# Patient Record
Sex: Female | Born: 1955 | Race: White | Hispanic: No | Marital: Married | Smoking: Never smoker
Health system: Southern US, Community
[De-identification: ages and names within clinical notes are randomized; demographics above are authoritative.]

## PROBLEM LIST (undated history)

## (undated) DIAGNOSIS — R1310 Dysphagia, unspecified: Secondary | ICD-10-CM

## (undated) DIAGNOSIS — K219 Gastro-esophageal reflux disease without esophagitis: Secondary | ICD-10-CM

## (undated) DIAGNOSIS — R0789 Other chest pain: Secondary | ICD-10-CM

## (undated) DIAGNOSIS — M1990 Unspecified osteoarthritis, unspecified site: Secondary | ICD-10-CM

## (undated) DIAGNOSIS — R001 Bradycardia, unspecified: Secondary | ICD-10-CM

## (undated) DIAGNOSIS — R002 Palpitations: Secondary | ICD-10-CM

## (undated) HISTORY — PX: CATARACT EXTRACTION: SUR2

## (undated) HISTORY — DX: Dysphagia, unspecified: R13.10

## (undated) HISTORY — DX: Other chest pain: R07.89

## (undated) HISTORY — PX: KNEE SURGERY: SHX244

## (undated) HISTORY — PX: WRIST SURGERY: SHX841

## (undated) HISTORY — PX: HEMORRHOID SURGERY: SHX153

## (undated) HISTORY — DX: Palpitations: R00.2

## (undated) HISTORY — DX: Bradycardia, unspecified: R00.1

## (undated) SURGERY — EGD (ESOPHAGOGASTRODUODENOSCOPY)
Anesthesia: Moderate Sedation

---

## 2000-12-03 ENCOUNTER — Encounter: Payer: Self-pay | Admitting: *Deleted

## 2000-12-03 ENCOUNTER — Ambulatory Visit (HOSPITAL_COMMUNITY): Admission: RE | Admit: 2000-12-03 | Discharge: 2000-12-03 | Payer: Self-pay | Admitting: *Deleted

## 2000-12-28 ENCOUNTER — Other Ambulatory Visit: Admission: RE | Admit: 2000-12-28 | Discharge: 2000-12-28 | Payer: Self-pay | Admitting: General Surgery

## 2005-09-01 ENCOUNTER — Ambulatory Visit (HOSPITAL_COMMUNITY): Payer: Self-pay | Admitting: Psychiatry

## 2005-09-06 ENCOUNTER — Ambulatory Visit (HOSPITAL_COMMUNITY): Payer: Self-pay | Admitting: Psychiatry

## 2005-09-12 ENCOUNTER — Ambulatory Visit (HOSPITAL_COMMUNITY): Payer: Self-pay | Admitting: Psychiatry

## 2005-09-19 ENCOUNTER — Ambulatory Visit (HOSPITAL_COMMUNITY): Payer: Self-pay | Admitting: Psychiatry

## 2005-10-06 ENCOUNTER — Ambulatory Visit (HOSPITAL_COMMUNITY): Payer: Self-pay | Admitting: Psychiatry

## 2006-01-05 ENCOUNTER — Ambulatory Visit: Payer: Self-pay | Admitting: Internal Medicine

## 2006-01-05 ENCOUNTER — Ambulatory Visit (HOSPITAL_COMMUNITY): Admission: RE | Admit: 2006-01-05 | Discharge: 2006-01-05 | Payer: Self-pay | Admitting: Internal Medicine

## 2008-12-20 ENCOUNTER — Emergency Department (HOSPITAL_COMMUNITY): Admission: EM | Admit: 2008-12-20 | Discharge: 2008-12-20 | Payer: Self-pay | Admitting: Emergency Medicine

## 2010-06-18 ENCOUNTER — Emergency Department (HOSPITAL_COMMUNITY)
Admission: EM | Admit: 2010-06-18 | Discharge: 2010-06-18 | Payer: Self-pay | Source: Home / Self Care | Admitting: Emergency Medicine

## 2010-11-04 NOTE — Op Note (Signed)
Annette Vance, Annette Vance               ACCOUNT NO.:  [id number]   MEDICAL RECORD NO.:  [id number]          PATIENT TYPE:  AMB   LOCATION:  DAY                           FACILITY:  [facility]   PHYSICIAN:  Lionel December, M.D.    DATE OF BIRTH:  1955/11/24   DATE OF PROCEDURE:  01/05/2006  DATE OF DISCHARGE:                                 OPERATIVE REPORT   PROCEDURE:  Esophagogastroduodenoscopy with esophageal dilation.   [name] is 55 year old Caucasian female who has had sporadic episodes of  dysphagia over the last few years, but she had an episode over the weekend.  She had a foreign body over the weekend while she was swallowing meat.  It  finally cleared her esophagus 3 hours later.  She had another episode this  morning while she was eating bacon.  She was seen by Dr. Nobie Putnam and sent  over for therapeutic procedure.  She states that she possibly has passed  past foreign body, again as she is not having chest pain any more.  She  denies chronic frequent heartburn.  Procedure was reviewed the patient;  informed consent was obtained.   MEDICATIONS FOR CONSCIOUS SEDATION:  1.  Benzocaine spray for pharyngeal topical anesthesia.  2.  Demerol 40 mg IV.  3.  Versed 10 mg IV.   FINDINGS:  Procedure performed in endoscopy suite.  The patient's vital  signs and O2 saturation were monitored during the procedure and remained  stable.  The patient was placed in left lateral decubitus position, and  Olympus video scope was passed via oropharynx without any difficulty into  esophagus.   Esophagus mucosa of the esophagus normal.  There was no foreign body in the  esophagus.  There was a prominent Schatzki's ring at 37 cm from the  incisors.  Hiatus was at 39.  She had small sliding hiatal hernia.  There  were no erosions also noted.   Stomach.  It was empty and distended very well with insufflation.  Folds of  proximal stomach were normal.  Examination mucosa at body, antrum, pyloric  channel as  well as angularis, fundus, and cardia was normal.   Duodenum and bulbar mucosa was normal.  Scope was passed in second part of  the duodenum mucosa, and folds were normal.   Endoscope was pulled back in the stomach.  Balloon dilator was passed  through the scope.  A guidewire was pushes in the gastric lumen.  Endoscope  was pulled in the body of the stomach and balloon dilator was positioned  across the distal esophagus and initially insufflated to a diameter of 19  mm.  It caused ring to break or disrupt at 3 o'clock.  Subsequently at the  insufflator diameter of 20 mm.  A ring was disrupted at 2 more points and  was completely obliterated.  Balloon was disrupted at 2 more sites and  completely obliterated.  There was minimal oozing which stopped  spontaneously.  Pictures taken for the record.  Endoscope was withdrawn.  The patient tolerated the procedure well.   FINAL DIAGNOSIS:  1.  High-grade Schatzki's  ring which was dilated/disrupted using balloon up      to 20 mm.  2.  Small sliding hiatal hernia.  3.  Foreign body spontaneously passed.   RECOMMENDATIONS:  The patient advised to chew her food thoroughly.   She will call us if dysphagia recurs.      Lionel December, M.D.  Electronically Signed     NR/MEDQ  D:  01/05/2006  T:  01/05/2006  Job:  [id number]   cc:   Patrica Duel, M.D.  Fax: [phone]

## 2011-05-25 ENCOUNTER — Encounter (INDEPENDENT_AMBULATORY_CARE_PROVIDER_SITE_OTHER): Payer: Self-pay | Admitting: *Deleted

## 2011-05-31 ENCOUNTER — Ambulatory Visit (INDEPENDENT_AMBULATORY_CARE_PROVIDER_SITE_OTHER): Payer: Self-pay | Admitting: Internal Medicine

## 2011-06-07 ENCOUNTER — Encounter (INDEPENDENT_AMBULATORY_CARE_PROVIDER_SITE_OTHER): Payer: Self-pay | Admitting: *Deleted

## 2012-05-02 IMAGING — CR DG CHEST 2V
2 series · 2 of 2 positions shown · non-contrast
Comparison: 12/20/2008

CLINICAL DATA: Chest congestion.  Cough.

CHEST - 2 VIEW

[view not recorded (1 of 2)]
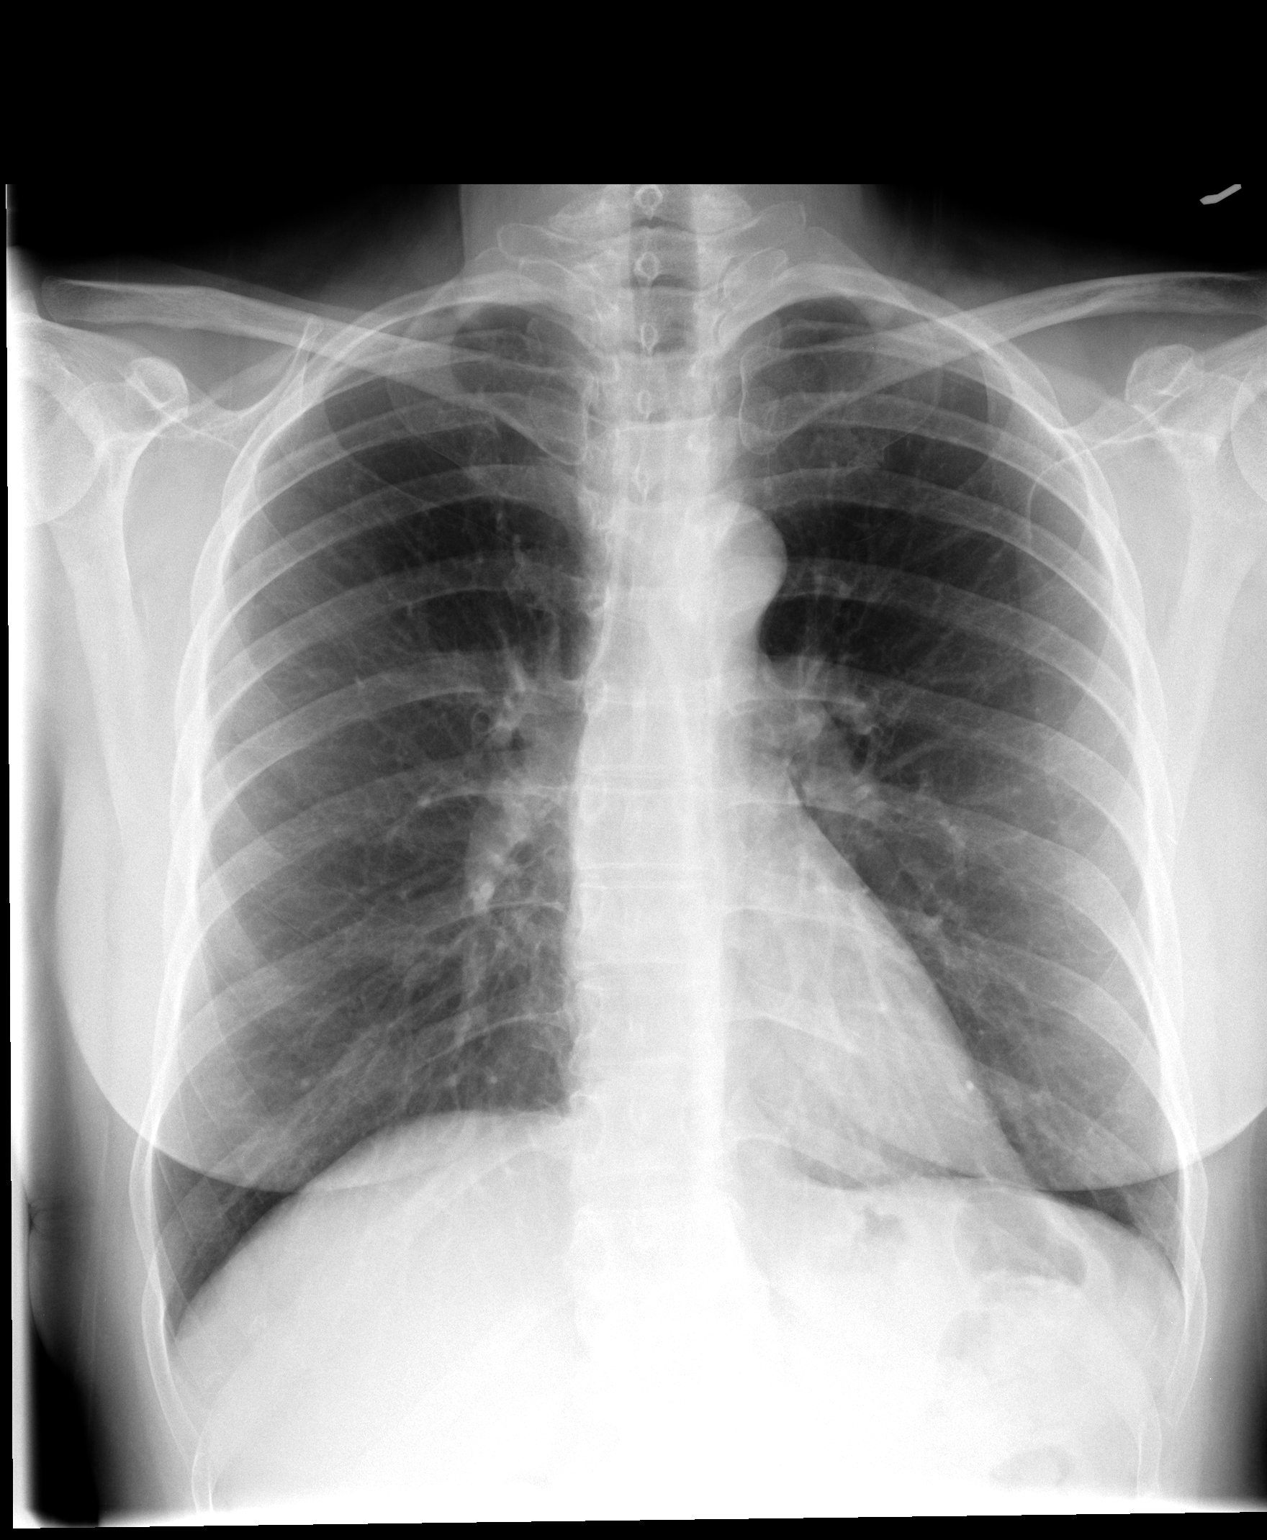

[view not recorded (2 of 2)]
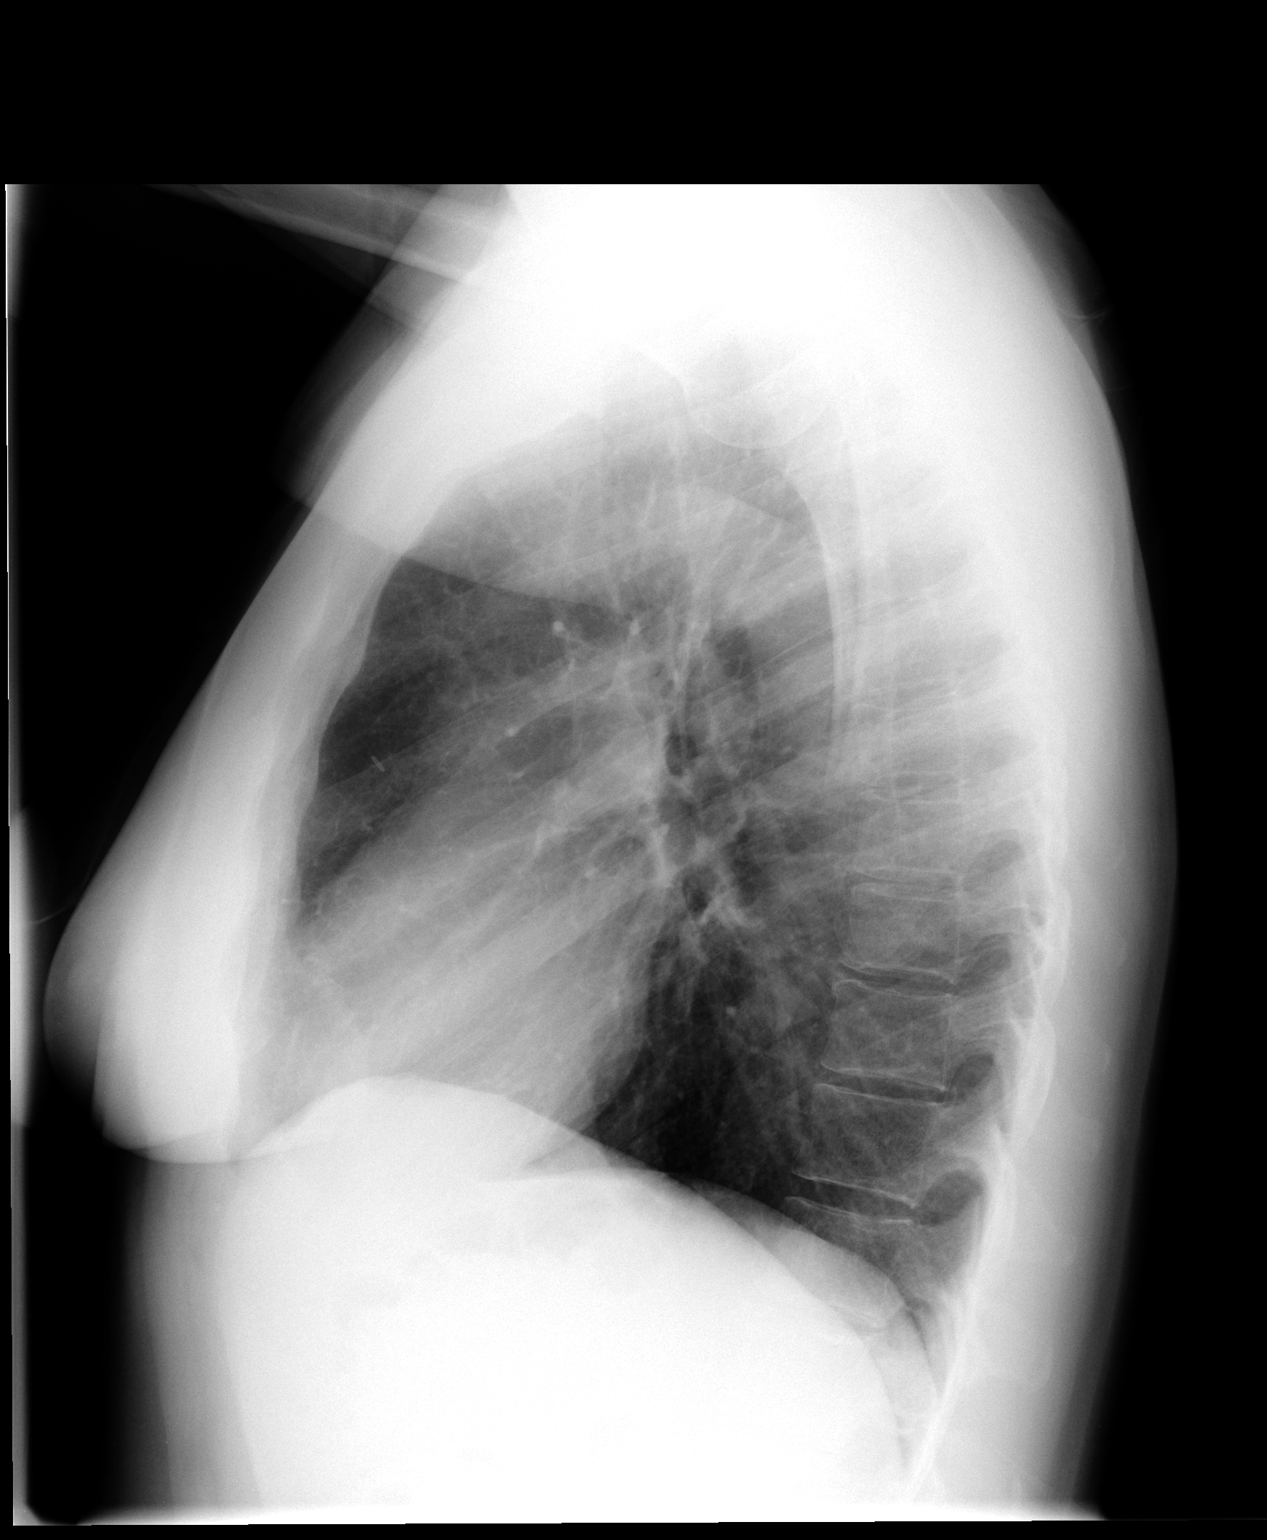

[2 of 2 positions shown; findings below may reference images not displayed]

FINDINGS: Minimal S-shaped spinal curvature. Midline trachea.
Normal heart size and mediastinal contours. No pleural effusion or
pneumothorax.  Mild biapical pleural thickening. Clear lungs.
IMPRESSION: No acute cardiopulmonary disease.

## 2013-11-19 ENCOUNTER — Encounter (INDEPENDENT_AMBULATORY_CARE_PROVIDER_SITE_OTHER): Payer: Self-pay | Admitting: *Deleted

## 2013-11-26 ENCOUNTER — Other Ambulatory Visit (INDEPENDENT_AMBULATORY_CARE_PROVIDER_SITE_OTHER): Payer: Self-pay | Admitting: *Deleted

## 2013-11-26 ENCOUNTER — Encounter (INDEPENDENT_AMBULATORY_CARE_PROVIDER_SITE_OTHER): Payer: Self-pay | Admitting: *Deleted

## 2013-11-26 ENCOUNTER — Encounter (INDEPENDENT_AMBULATORY_CARE_PROVIDER_SITE_OTHER): Payer: Self-pay | Admitting: Internal Medicine

## 2013-11-26 ENCOUNTER — Ambulatory Visit (INDEPENDENT_AMBULATORY_CARE_PROVIDER_SITE_OTHER): Payer: 59 | Admitting: Internal Medicine

## 2013-11-26 VITALS — BP 90/60 | HR 72 | Temp 98.0°F | Ht 67.5 in | Wt 159.6 lb

## 2013-11-26 DIAGNOSIS — R1310 Dysphagia, unspecified: Secondary | ICD-10-CM

## 2013-11-26 NOTE — Patient Instructions (Signed)
EGD/ED 

## 2013-11-26 NOTE — Progress Notes (Signed)
This encounter was created in error - please disregard.

## 2013-11-26 NOTE — Progress Notes (Signed)
Subjective:     Patient ID: Annette Vance, female   DOB: November 29, 1955, 58 y.o.   MRN: [id number]  HPI Referred to our office by Dr Hilma Favors [facility]) for GERD and dysphagia.  Presents today with c/o dysphagia. She says when the foods lodges it is very painful. Occurs 3-4 times in the pat 12 months. Chicken, steak, corn bread will lodge.  She has to chew her food well. Last episode 3-4 months ago.  Appetite is good. No weight loss.  No abdominal pain. BMs move normal. No melena or bright red rectal bleeding.  Hx of dysphagia in past and has undergone an EGD/ED (see below)    01/05/2006 EGD/ED: dysphagia: Dr. Laural Golden: FINAL DIAGNOSIS:  1. High-grade Schatzki's ring which was dilated/disrupted using balloon up  to 20 mm.  2. Small sliding hiatal hernia.  3. Foreign body spontaneously passed.     Review of Systems Past Medical History  Diagnosis Date  . Dysphagia     Past Surgical History  Procedure Laterality Date  . Knee surgery      1996  . Wrist surgery      ?    No Known Allergies  No current outpatient prescriptions on file prior to visit.   No current facility-administered medications on file prior to visit.  Single, One daughter in good health.       Objective:   Physical Exam  Filed Vitals:   11/26/13 1606  BP: 90/60  Pulse: 72  Temp: 98 F (36.7 C)  Height: 5' 7.5" (1.715 m)  Weight: 159 lb 9.6 oz (72.394 kg)  Alert and oriented. Skin warm and dry. Oral mucosa is moist.   . Sclera anicteric, conjunctivae is pink. Thyroid not enlarged. No cervical lymphadenopathy. Lungs clear. Heart regular rate and rhythm.  Abdomen is soft. Bowel sounds are positive. No hepatomegaly. No abdominal masses felt. No tenderness.  No edema to lower extremities.        Assessment:     Dysphagia to solids.  Esophageal ring needs to be ruled out.    Plan:     EGD/ED.The risks and benefits such as perforation, bleeding, and infection were reviewed with the patient  and is agreeable.

## 2013-11-27 DIAGNOSIS — R1310 Dysphagia, unspecified: Secondary | ICD-10-CM | POA: Insufficient documentation

## 2013-12-10 ENCOUNTER — Encounter (HOSPITAL_COMMUNITY): Payer: Self-pay | Admitting: Pharmacy Technician

## 2013-12-11 ENCOUNTER — Encounter (INDEPENDENT_AMBULATORY_CARE_PROVIDER_SITE_OTHER): Payer: Self-pay | Admitting: *Deleted

## 2013-12-11 NOTE — Progress Notes (Signed)
This encounter was created in error - please disregard.

## 2013-12-25 ENCOUNTER — Encounter (HOSPITAL_COMMUNITY): Payer: Self-pay | Admitting: *Deleted

## 2013-12-25 ENCOUNTER — Encounter (HOSPITAL_COMMUNITY)
Admission: RE | Disposition: A | Discharge status: Home / Self Care | Payer: Self-pay | Source: Ambulatory Visit | Attending: Internal Medicine

## 2013-12-25 ENCOUNTER — Ambulatory Visit (HOSPITAL_COMMUNITY)
Admission: RE | Admit: 2013-12-25 | Discharge: 2013-12-25 | Disposition: A | Discharge status: Home / Self Care | Payer: 59 | Source: Ambulatory Visit | Attending: Internal Medicine | Admitting: Internal Medicine

## 2013-12-25 DIAGNOSIS — K222 Esophageal obstruction: Secondary | ICD-10-CM | POA: Insufficient documentation

## 2013-12-25 DIAGNOSIS — K228 Other specified diseases of esophagus: Secondary | ICD-10-CM

## 2013-12-25 DIAGNOSIS — K449 Diaphragmatic hernia without obstruction or gangrene: Secondary | ICD-10-CM | POA: Insufficient documentation

## 2013-12-25 DIAGNOSIS — R1310 Dysphagia, unspecified: Secondary | ICD-10-CM | POA: Insufficient documentation

## 2013-12-25 DIAGNOSIS — K2289 Other specified disease of esophagus: Secondary | ICD-10-CM

## 2013-12-25 HISTORY — PX: ESOPHAGOGASTRODUODENOSCOPY: SHX5428

## 2013-12-25 HISTORY — PX: BALLOON DILATION: SHX5330

## 2013-12-25 HISTORY — PX: BIOPSY: SHX5522

## 2013-12-25 SURGERY — EGD (ESOPHAGOGASTRODUODENOSCOPY)
Anesthesia: Moderate Sedation

## 2013-12-25 MED ORDER — MEPERIDINE HCL 50 MG/ML IJ SOLN
INTRAMUSCULAR | Status: DC | PRN
  Administered 2013-12-25 (×2): 25 mg via INTRAVENOUS

## 2013-12-25 MED ORDER — MEPERIDINE HCL 50 MG/ML IJ SOLN
INTRAMUSCULAR | Status: AC
  Filled 2013-12-25: qty 1

## 2013-12-25 MED ORDER — BUTAMBEN-TETRACAINE-BENZOCAINE 2-2-14 % EX AERO
INHALATION_SPRAY | CUTANEOUS | Status: DC | PRN
  Administered 2013-12-25: 2 via TOPICAL

## 2013-12-25 MED ORDER — PANTOPRAZOLE SODIUM 40 MG PO TBEC: 40.0000 mg | DELAYED_RELEASE_TABLET | Freq: Every day | ORAL | Status: DC

## 2013-12-25 MED ORDER — SIMETHICONE 40 MG/0.6ML PO SUSP
ORAL | Status: DC | PRN
  Administered 2013-12-25: 13:00:00

## 2013-12-25 MED ORDER — MIDAZOLAM HCL 5 MG/5ML IJ SOLN
INTRAMUSCULAR | Status: AC
  Filled 2013-12-25: qty 10

## 2013-12-25 MED ORDER — SODIUM CHLORIDE 0.9 % IV SOLN
INTRAVENOUS | Status: DC
  Administered 2013-12-25: 12:00:00 via INTRAVENOUS

## 2013-12-25 MED ORDER — MIDAZOLAM HCL 5 MG/5ML IJ SOLN
INTRAMUSCULAR | Status: DC | PRN
  Administered 2013-12-25 (×5): 2 mg via INTRAVENOUS

## 2013-12-25 NOTE — H&P (Signed)
Annette Vance is an 58 y.o. female.   Chief Complaint: Patient is here for EGD and ED. HPI: Patient is 58 year old Caucasian female who presents with a 12 month history of solid food dysphagia. Lately the symptoms been occurring more frequently. She had 5-6 episodes of food impaction this year relieved with regurgitation. She rarely experiences heartburn. She has good appetite. She denies nausea vomiting abdominal pain or melena. She has history of Schatzki's ring which was last dilated in July 2007.  Past Medical History  Diagnosis Date  . Dysphagia     Past Surgical History  Procedure Laterality Date  . Knee surgery      1996  . Wrist surgery      ?    History reviewed. No pertinent family history. Social History:  reports that she has never smoked. She does not have any smokeless tobacco history on file. She reports that she drinks alcohol. She reports that she does not use illicit drugs.  Allergies: No Known Allergies  Medications Prior to Admission  Medication Sig Dispense Refill  . lactobacillus acidophilus (BACID) TABS tablet Take 2 tablets by mouth 3 (three) times daily.        No results found for this or any previous visit (from the past 48 hour(s)). No results found.  ROS  Blood pressure 102/67, pulse 69, temperature 97.5 F (36.4 C), temperature source Oral, height 5\' 7"  (1.702 m), weight 150 lb (68.04 kg), SpO2 97.00%. Physical Exam  Constitutional: She appears well-developed and well-nourished.  HENT:  Mouth/Throat: Oropharynx is clear and moist.  Eyes: Conjunctivae are normal. No scleral icterus.  Neck: No thyromegaly present.  Cardiovascular: Normal rate, regular rhythm and normal heart sounds.   No murmur heard. Respiratory: Effort normal and breath sounds normal.  GI: Soft. She exhibits no distension and no mass. There is no tenderness.  Musculoskeletal: She exhibits no edema.  Lymphadenopathy:    She has no cervical adenopathy.  Neurological: She  is alert.  Skin: Skin is warm and dry.     Assessment/Plan Solid food dysphagia. History of Schatzki's ring. EGD with ED.  Aunesty Tyson U 12/25/2013, 12:38 PM

## 2013-12-25 NOTE — Op Note (Signed)
EGD PROCEDURE REPORT  PATIENT:  Annette Vance  MR#:  [id number] Birthdate:  1956/03/19, 58 y.o., female Endoscopist:  Dr. Rogene Houston, MD Referred By:  Dr. Purvis Kilts, MD  Procedure Date: 12/25/2013  Procedure:   EGD with ED.  Indications:  Patient is 58 year old Caucasian female who presents with several month history of dysphagia to solids. She said episodes of food impaction this year. She has history of Schatzki's ring which was last dilated in July 2007. She rarely has heartburn.            Informed Consent:  The risks, benefits, alternatives & imponderables which include, but are not limited to, bleeding, infection, perforation, drug reaction and potential missed lesion have been reviewed.  The potential for biopsy, lesion removal, esophageal dilation, etc. have also been discussed.  Questions have been answered.  All parties agreeable.  Please see history & physical in medical record for more information.  Medications:  Demerol 50 mg IV Versed 10 mg IV Cetacaine spray topically for oropharyngeal anesthesia  Description of procedure:  The endoscope was introduced through the mouth and advanced to the second portion of the duodenum without difficulty or limitations. The mucosal surfaces were surveyed very carefully during advancement of the scope and upon withdrawal.  Findings:  Esophagus:  Mucosa of the proximal segment was normal. Mucosa and esophageal body revealed coarse appearance. High grade Schatzki's ring noted at GE junction with focal edema and erythema to mucosa. GEJ:  32 cm Hiatus:  36 cm Stomach:  Stomach was empty and distended very well with insufflation. Folds in the proximal stomach are normal. Examination of mucosa atgastric body, antrum, pyloric channel, angularis fundus and cardia was normal. Hernia was easily seen in this view. Duodenum:  Normal bulbar and post bulbar mucosa.  Therapeutic/Diagnostic Maneuvers Performed:   Schatzki's ring was  dilated/disrupted with balloon dilator. The balloon dilator was advanced with the scope. The guidewire was pushed into the gastric lumen. The scope was pulled back into the esophagus and balloon was positioned across Schatzki's ring and insufflated to a diameter of 15 mm and subsequently 16.5 and to 18 mm disrupting the ring. The ring was further disrupted the focal biopsy. Biopsy was taken from mucosa esophageal body for routine histology.  Complications:  None  Impression: Coarse appearance to esophageal mucosa raising possibility of eosinophilic esophagitis. High grade Schatzki's ring but focal changes of reflux esophagitis. Small sliding hiatal hernia. Schatzki's ring dilated to/disrupted with balloon to a diameter of 18 mm. It was further disrupted with focal biopsy. Biopsy taken from esophageal mucosa to rule out eosinophilic esophagitis.  Recommendations:  Anti-reflux measures. Pantoprazole 40 mg by mouth q. A.m. I will be contacting patient results of biopsy and further recommendations.  Tory Septer U  12/25/2013  1:10 PM  CC: Dr. Hilma Favors, Betsy Coder, MD & Dr. Rayne Du ref. provider found

## 2013-12-25 NOTE — Discharge Instructions (Signed)
Resume usual medications and diet. Pantoprazole 40 mg by mouth 30 minutes before breakfast daily. No driving for 24 hours. Physician will call with biopsy results.   Dysphagia Swallowing problems (dysphagia) occur when solids and liquids seem to stick in your throat on the way down to your stomach, or the food takes longer to get to the stomach. Other symptoms include regurgitating food, noises coming from the throat, chest discomfort with swallowing, and a feeling of fullness or the feeling of something being stuck in your throat when swallowing. When blockage in your throat is complete it may be associated with drooling. CAUSES  Problems with swallowing may occur because of problems with the muscles. The food cannot be propelled in the usual manner into your stomach. You may have ulcers, scar tissue, or inflammation in the tube down which food travels from your mouth to your stomach (esophagus), which blocks food from passing normally into the stomach. Causes of inflammation include:  Acid reflux from your stomach into your esophagus.  Infection.  Radiation treatment for cancer.  Medicines taken without enough fluids to wash them down into your stomach. You may have nerve problems that prevent signals from being sent to the muscles of your esophagus to contract and move your food down to your stomach. Globus pharyngeus is a relatively common problem in which there is a sense of an obstruction or difficulty in swallowing, without any physical abnormalities of the swallowing passages being found. This problem usually improves over time with reassurance and testing to rule out other causes. DIAGNOSIS Dysphagia can be diagnosed and its cause can be determined by tests in which you swallow a white substance that helps illuminate the inside of your throat (contrast medium) while X-rays are taken. Sometimes a flexible telescope that is inserted down your throat (endoscopy) to look at your esophagus  and stomach is used. TREATMENT   If the dysphagia is caused by acid reflux or infection, medicines may be used.  If the dysphagia is caused by problems with your swallowing muscles, swallowing therapy may be used to help you strengthen your swallowing muscles.  If the dysphagia is caused by a blockage or mass, procedures to remove the blockage may be done. HOME CARE INSTRUCTIONS  Try to eat soft food that is easier to swallow and check your weight on a daily basis to be sure that it is not decreasing.  Be sure to drink liquids when sitting upright (not lying down). SEEK MEDICAL CARE IF:  You are losing weight because you are unable to swallow.  You are coughing when you drink liquids (aspiration).  You are coughing up partially digested food. SEEK IMMEDIATE MEDICAL CARE IF:  You are unable to swallow your own saliva .  You are having shortness of breath or a fever, or both.  You have a hoarse voice along with difficulty swallowing. MAKE SURE YOU:  Understand these instructions.  Will watch your condition.  Will get help right away if you are not doing well or get worse. Document Released: 06/02/2000 Document Revised: 02/05/2013 Document Reviewed: 11/22/2012 [facility] Patient Information 2015 [place], [place]. This information is not intended to replace advice given to you by your health care provider. Make sure you discuss any questions you have with your health care provider. Esophagogastroduodenoscopy Care After Refer to this sheet in the next few weeks. These instructions provide you with information on caring for yourself after your procedure. Your caregiver may also give you more specific instructions. Your treatment has been  planned according to current medical practices, but problems sometimes occur. Call your caregiver if you have any problems or questions after your procedure.  HOME CARE INSTRUCTIONS  Do not eat or drink anything until the numbing medicine (local  anesthetic) has worn off and your gag reflex has returned. You will know that the local anesthetic has worn off when you can swallow comfortably.  Do not drive for 12 hours after the procedure or as directed by your caregiver.  Only take medicines as directed by your caregiver. SEEK MEDICAL CARE IF:   You cannot stop coughing.  You are not urinating at all or less than usual. SEEK IMMEDIATE MEDICAL CARE IF:  You have difficulty swallowing.  You cannot eat or drink.  You have worsening throat or chest pain.  You have dizziness, lightheadedness, or you faint.  You have nausea or vomiting.  You have chills.  You have a fever.  You have severe abdominal pain.  You have black, tarry, or bloody stools. Document Released: 05/22/2012 Document Reviewed: 05/22/2012 [facility] Patient Information 2015 [place]. This information is not intended to replace advice given to you by your health care provider. Make sure you discuss any questions you have with your health care provider.

## 2013-12-29 ENCOUNTER — Encounter (HOSPITAL_COMMUNITY): Payer: Self-pay | Admitting: Internal Medicine

## 2013-12-30 ENCOUNTER — Encounter (INDEPENDENT_AMBULATORY_CARE_PROVIDER_SITE_OTHER): Payer: Self-pay | Admitting: *Deleted

## 2014-06-24 ENCOUNTER — Other Ambulatory Visit (INDEPENDENT_AMBULATORY_CARE_PROVIDER_SITE_OTHER): Payer: Self-pay | Admitting: *Deleted

## 2014-06-24 ENCOUNTER — Encounter (INDEPENDENT_AMBULATORY_CARE_PROVIDER_SITE_OTHER): Payer: Self-pay | Admitting: Internal Medicine

## 2014-06-24 ENCOUNTER — Ambulatory Visit (INDEPENDENT_AMBULATORY_CARE_PROVIDER_SITE_OTHER): Payer: 59 | Admitting: Internal Medicine

## 2014-06-24 ENCOUNTER — Telehealth (INDEPENDENT_AMBULATORY_CARE_PROVIDER_SITE_OTHER): Payer: Self-pay | Admitting: *Deleted

## 2014-06-24 VITALS — BP 82/50 | HR 64 | Temp 98.7°F | Ht 67.0 in | Wt 164.8 lb

## 2014-06-24 DIAGNOSIS — R1314 Dysphagia, pharyngoesophageal phase: Secondary | ICD-10-CM

## 2014-06-24 DIAGNOSIS — Z1211 Encounter for screening for malignant neoplasm of colon: Secondary | ICD-10-CM

## 2014-06-24 NOTE — Patient Instructions (Signed)
Screening colonoscopy 

## 2014-06-24 NOTE — Progress Notes (Signed)
   Subjective:    Patient ID: Annette Vance, female    DOB: 1955/07/04, 59 y.o.   MRN: [id number]  HPI For follow up of her EGD/ED in July of 2015. She tells me she is not having any problems with dysphagia. She is eating what she wants. Appetite is good. No weight loss. Has a BM daily. No melena or BRRB.  Her last colonoscopy was 10 yrs ago at age 59 by Dr. Lindalou Hose which was normal she reports.  She is due for one now.   No GI problems.      12/25/2013 EGD/ED:   Indications: Patient is 59 year old Caucasian female who presents with several month history of dysphagia to solids. She said episodes of food impaction this year. She has history of Schatzki's ring which was last dilated in July 2007. She rarely has heartburn. Coarse appearance to esophageal mucosa raising possibility of eosinophilic esophagitis. High grade Schatzki's ring but focal changes of reflux esophagitis. Small sliding hiatal hernia. Schatzki's ring dilated to/disrupted with balloon to a diameter of 18 mm. It was further disrupted with focal biopsy. Biopsy taken from esophageal mucosa to rule out eosinophilic esophagitis.  Review of Systems  Divorced, one child in good health. Work at [employer].  Past Medical History  Diagnosis Date  . Dysphagia     Past Surgical History  Procedure Laterality Date  . Knee surgery      1996  . Wrist surgery      ?  . Esophagogastroduodenoscopy N/A 12/25/2013    Procedure: ESOPHAGOGASTRODUODENOSCOPY (EGD);  Surgeon: Rogene Houston, MD;  Location: [facility]O SUITE;  Service: Endoscopy;  Laterality: N/A;  1255  . Balloon dilation N/A 12/25/2013    Procedure: BALLOON DILATION;  Surgeon: Rogene Houston, MD;  Location: [facility] ENDO SUITE;  Service: Endoscopy;  Laterality: N/A;  . Esophageal biopsy  12/25/2013    Procedure: BIOPSY;  Surgeon: Rogene Houston, MD;  Location: [facility] ENDO SUITE;   Service: Endoscopy;;    No Known Allergies  Current Outpatient Prescriptions on File Prior to Visit  Medication Sig Dispense Refill  . lactobacillus acidophilus (BACID) TABS tablet Take 2 tablets by mouth 3 (three) times daily.    . pantoprazole (PROTONIX) 40 MG tablet Take 1 tablet (40 mg total) by mouth daily. 30 tablet 5   No current facility-administered medications on file prior to visit.        Objective:   Physical Exam   Filed Vitals:   06/24/14 1137  Height: 5\' 7"  (1.702 m)  Weight: 164 lb 12.8 oz (74.753 kg)   Alert and oriented. Skin warm and dry. Oral mucosa is moist.   . Sclera anicteric, conjunctivae is pink. Thyroid not enlarged. No cervical lymphadenopathy. Lungs clear. Heart regular rate and rhythm.  Abdomen is soft. Bowel sounds are positive. No hepatomegaly. No abdominal masses felt. No tenderness.  No edema to lower extremities.         Assessment & Plan:  Dysphagia resolved after ED in July. No problems. In need of screening colonoscopy.

## 2014-06-24 NOTE — Telephone Encounter (Signed)
Patient needs movi prep 

## 2014-06-26 MED ORDER — PEG-KCL-NACL-NASULF-NA ASC-C 100 G PO SOLR: 1.0000 | Freq: Once | ORAL | Status: DC

## 2014-07-14 ENCOUNTER — Encounter (INDEPENDENT_AMBULATORY_CARE_PROVIDER_SITE_OTHER): Payer: Self-pay | Admitting: *Deleted

## 2014-07-21 ENCOUNTER — Telehealth (INDEPENDENT_AMBULATORY_CARE_PROVIDER_SITE_OTHER): Payer: Self-pay | Admitting: *Deleted

## 2014-07-21 DIAGNOSIS — Z1211 Encounter for screening for malignant neoplasm of colon: Secondary | ICD-10-CM

## 2014-07-21 NOTE — Telephone Encounter (Signed)
Patient needs movi prep, pharmacy didn't get

## 2014-07-23 MED ORDER — PEG-KCL-NACL-NASULF-NA ASC-C 100 G PO SOLR: 1.0000 | Freq: Once | ORAL | Status: DC

## 2014-07-29 ENCOUNTER — Encounter (HOSPITAL_COMMUNITY)
Admission: RE | Disposition: A | Discharge status: Home / Self Care | Payer: Self-pay | Source: Ambulatory Visit | Attending: Internal Medicine

## 2014-07-29 ENCOUNTER — Encounter (HOSPITAL_COMMUNITY): Payer: Self-pay

## 2014-07-29 ENCOUNTER — Ambulatory Visit (HOSPITAL_COMMUNITY)
Admission: RE | Admit: 2014-07-29 | Discharge: 2014-07-29 | Disposition: A | Discharge status: Home / Self Care | Payer: 59 | Source: Ambulatory Visit | Attending: Internal Medicine | Admitting: Internal Medicine

## 2014-07-29 DIAGNOSIS — K219 Gastro-esophageal reflux disease without esophagitis: Secondary | ICD-10-CM | POA: Diagnosis not present

## 2014-07-29 DIAGNOSIS — Z1211 Encounter for screening for malignant neoplasm of colon: Secondary | ICD-10-CM | POA: Insufficient documentation

## 2014-07-29 DIAGNOSIS — K644 Residual hemorrhoidal skin tags: Secondary | ICD-10-CM | POA: Insufficient documentation

## 2014-07-29 DIAGNOSIS — K648 Other hemorrhoids: Secondary | ICD-10-CM

## 2014-07-29 DIAGNOSIS — Z79899 Other long term (current) drug therapy: Secondary | ICD-10-CM | POA: Insufficient documentation

## 2014-07-29 HISTORY — DX: Gastro-esophageal reflux disease without esophagitis: K21.9

## 2014-07-29 HISTORY — PX: COLONOSCOPY: SHX5424

## 2014-07-29 SURGERY — COLONOSCOPY
Anesthesia: Moderate Sedation

## 2014-07-29 MED ORDER — STERILE WATER FOR IRRIGATION IR SOLN
Status: DC | PRN
  Administered 2014-07-29: 09:00:00

## 2014-07-29 MED ORDER — MIDAZOLAM HCL 5 MG/5ML IJ SOLN
INTRAMUSCULAR | Status: DC | PRN
  Administered 2014-07-29 (×6): 2 mg via INTRAVENOUS

## 2014-07-29 MED ORDER — MIDAZOLAM HCL 5 MG/5ML IJ SOLN
INTRAMUSCULAR | Status: AC
  Filled 2014-07-29: qty 10

## 2014-07-29 MED ORDER — SODIUM CHLORIDE 0.9 % IV SOLN
INTRAVENOUS | Status: DC
  Administered 2014-07-29: 08:00:00 via INTRAVENOUS

## 2014-07-29 MED ORDER — MIDAZOLAM HCL 5 MG/5ML IJ SOLN
INTRAMUSCULAR | Status: AC
  Filled 2014-07-29: qty 5

## 2014-07-29 MED ORDER — MEPERIDINE HCL 50 MG/ML IJ SOLN
INTRAMUSCULAR | Status: AC
  Filled 2014-07-29: qty 1

## 2014-07-29 MED ORDER — MEPERIDINE HCL 50 MG/ML IJ SOLN
INTRAMUSCULAR | Status: DC | PRN
  Administered 2014-07-29 (×2): 25 mg via INTRAVENOUS

## 2014-07-29 NOTE — Op Note (Signed)
COLONOSCOPY PROCEDURE REPORT  PATIENT:  Annette Vance  MR#:  [id number] Birthdate:  Apr 13, 1956, 59 y.o., female Endoscopist:  Dr. Rogene Houston, MD Referred By:  Dr. Purvis Kilts, MD  Procedure Date: 07/29/2014  Procedure:   Colonoscopy  Indications:  Patient is 59 year old Caucasian female was undergoing average risk screening colonoscopy. Her last exam was 10 years ago.  Informed Consent:  The procedure and risks were reviewed with the patient and informed consent was obtained.  Medications:  Demerol 50 mg IV Versed 12 mg IV  Description of procedure:  After a digital rectal exam was performed, that colonoscope was advanced from the anus through the rectum and colon to the area of the cecum, ileocecal valve and appendiceal orifice. The cecum was deeply intubated. These structures were well-seen and photographed for the record. From the level of the cecum and ileocecal valve, the scope was slowly and cautiously withdrawn. The mucosal surfaces were carefully surveyed utilizing scope tip to flexion to facilitate fold flattening as needed. The scope was pulled down into the rectum where a thorough exam including retroflexion was performed. Exam was begun with pediatric colonoscope and completed with Slim colonoscope.  Findings:   Prep excellent. Redundant colon. Normal mucosa of cecum, ascending colon, hepatic flexure, transverse colon, splenic flexure, descending and sigmoid colon. Normal rectal mucosa. Prominent hemorrhoids below the dentate line.    Therapeutic/Diagnostic Maneuvers Performed:   None  Complications:  None  Cecal Withdrawal Time:  8 minutes  Impression:  Examination performed to cecum. Redundant colon but no evidence of colonic polyps and diverticulosis. External hemorrhoids.  Recommendations:  Standard instructions given. Next screening exam in 10 years.  Kaelan Amble U  07/29/2014 10:05 AM  CC: Dr. Hilma Favors, Betsy Coder, MD & Dr. Rayne Du ref.  provider found

## 2014-07-29 NOTE — Discharge Instructions (Signed)
Resume usual medications and diet. No driving for 24 hours. Next screening exam in 10 years.  Colonoscopy, Care After Refer to this sheet in the next few weeks. These instructions provide you with information on caring for yourself after your procedure. Your health care provider may also give you more specific instructions. Your treatment has been planned according to current medical practices, but problems sometimes occur. Call your health care provider if you have any problems or questions after your procedure. WHAT TO EXPECT AFTER THE PROCEDURE  After your procedure, it is typical to have the following:  A small amount of blood in your stool.  Moderate amounts of gas and mild abdominal cramping or bloating. HOME CARE INSTRUCTIONS  Do not drive, operate machinery, or sign important documents for 24 hours.  You may shower and resume your regular physical activities, but move at a slower pace for the first 24 hours.  Take frequent rest periods for the first 24 hours.  Walk around or put a warm pack on your abdomen to help reduce abdominal cramping and bloating.  Drink enough fluids to keep your urine clear or pale yellow.  You may resume your normal diet as instructed by your health care provider. Avoid heavy or fried foods that are hard to digest.  Avoid drinking alcohol for 24 hours or as instructed by your health care provider.  Only take over-the-counter or prescription medicines as directed by your health care provider.  If a tissue sample (biopsy) was taken during your procedure:  Do not take aspirin or blood thinners for 7 days, or as instructed by your health care provider.  Do not drink alcohol for 7 days, or as instructed by your health care provider.  Eat soft foods for the first 24 hours. SEEK MEDICAL CARE IF: You have persistent spotting of blood in your stool 2-3 days after the procedure. SEEK IMMEDIATE MEDICAL CARE IF:  You have more than a small spotting of  blood in your stool.  You pass large blood clots in your stool.  Your abdomen is swollen (distended).  You have nausea or vomiting.  You have a fever.  You have increasing abdominal pain that is not relieved with medicine. Document Released: 01/18/2004 Document Revised: 03/26/2013 Document Reviewed: 02/10/2013 [facility] Patient Information 2015 [place], [place]. This information is not intended to replace advice given to you by your health care provider. Make sure you discuss any questions you have with your health care provider.

## 2014-07-29 NOTE — H&P (Signed)
Annette Vance is an 59 y.o. female.   Chief Complaint: Patient is here for colonoscopy. HPI: Patient is 59 year old Caucasian female who is here for screening colonoscopy. She denies abdominal pain change in bowel habits or rectal bleeding. Last colonoscopy was 10 years ago. Family history is negative for CRC.  Past Medical History  Diagnosis Date  . Dysphagia   . GERD (gastroesophageal reflux disease)     Past Surgical History  Procedure Laterality Date  . Knee surgery      1996  . Wrist surgery      ?  . Esophagogastroduodenoscopy N/A 12/25/2013    Procedure: ESOPHAGOGASTRODUODENOSCOPY (EGD);  Surgeon: Rogene Houston, MD;  [facility];  Service: Endoscopy;  Laterality: N/A;  1255  . Balloon dilation N/A 12/25/2013    Procedure: BALLOON DILATION;  Surgeon: Rogene Houston, MD;  [facility];  Service: Endoscopy;  Laterality: N/A;  . Esophageal biopsy  12/25/2013    Procedure: BIOPSY;  Surgeon: Rogene Houston, MD;  [facility];  Service: Endoscopy;;    History reviewed. No pertinent family history. Social History:  reports that she has never smoked. She does not have any smokeless tobacco history on file. She reports that she drinks alcohol. She reports that she does not use illicit drugs.  Allergies: No Known Allergies  Medications Prior to Admission  Medication Sig Dispense Refill  . lactobacillus acidophilus (BACID) TABS tablet Take 2 tablets by mouth 3 (three) times daily.    . pantoprazole (PROTONIX) 40 MG tablet Take 1 tablet (40 mg total) by mouth daily. 30 tablet 5  . peg 3350 powder (MOVIPREP) 100 G SOLR Take 1 kit (200 g total) by mouth once. 1 kit 0    No results found for this or any previous visit (from the past 48 hour(s)). No results found.  ROS  Blood pressure 108/69, pulse 72, temperature 97.9 F (36.6 C), temperature source Oral, resp. rate 21, height $RemoveBe'5\' 7"'gqtsOvTLH$  (1.702 m), weight 152 lb (68.947 kg), SpO2 98 %. Physical Exam   Constitutional: She appears well-developed and well-nourished.  HENT:  Mouth/Throat: Oropharynx is clear and moist.  Eyes: Conjunctivae are normal. No scleral icterus.  Neck: No thyromegaly present.  Cardiovascular: Normal rate, regular rhythm and normal heart sounds.   No murmur heard. Respiratory: Effort normal and breath sounds normal.  GI: Soft. She exhibits no distension and no mass. There is no tenderness.  Musculoskeletal: She exhibits no edema.  Lymphadenopathy:    She has no cervical adenopathy.  Neurological: She is alert.  Skin: Skin is warm and dry.     Assessment/Plan Average risk screening colonoscopy.  Annette Vance 07/29/2014, 9:10 AM

## 2014-07-30 ENCOUNTER — Encounter (HOSPITAL_COMMUNITY): Payer: Self-pay | Admitting: Internal Medicine

## 2015-02-01 ENCOUNTER — Other Ambulatory Visit (INDEPENDENT_AMBULATORY_CARE_PROVIDER_SITE_OTHER): Payer: Self-pay | Admitting: Internal Medicine

## 2016-06-22 DIAGNOSIS — M170 Bilateral primary osteoarthritis of knee: Secondary | ICD-10-CM | POA: Diagnosis not present

## 2016-06-22 DIAGNOSIS — M25562 Pain in left knee: Secondary | ICD-10-CM | POA: Diagnosis not present

## 2016-06-22 DIAGNOSIS — M25561 Pain in right knee: Secondary | ICD-10-CM | POA: Diagnosis not present

## 2016-07-20 DIAGNOSIS — R0789 Other chest pain: Secondary | ICD-10-CM | POA: Diagnosis not present

## 2016-07-20 DIAGNOSIS — R001 Bradycardia, unspecified: Secondary | ICD-10-CM | POA: Diagnosis not present

## 2016-07-20 DIAGNOSIS — Z6823 Body mass index (BMI) 23.0-23.9, adult: Secondary | ICD-10-CM | POA: Diagnosis not present

## 2016-08-22 ENCOUNTER — Ambulatory Visit: Payer: 59 | Admitting: Cardiology

## 2016-08-25 DIAGNOSIS — R002 Palpitations: Secondary | ICD-10-CM

## 2016-08-28 ENCOUNTER — Other Ambulatory Visit (HOSPITAL_COMMUNITY)
Admission: RE | Admit: 2016-08-28 | Discharge: 2016-08-28 | Disposition: A | Discharge status: Home / Self Care | Payer: Commercial Managed Care - HMO | Source: Ambulatory Visit | Attending: Cardiovascular Disease | Admitting: Cardiovascular Disease

## 2016-08-28 ENCOUNTER — Ambulatory Visit (INDEPENDENT_AMBULATORY_CARE_PROVIDER_SITE_OTHER): Payer: Commercial Managed Care - HMO | Admitting: Cardiovascular Disease

## 2016-08-28 ENCOUNTER — Encounter: Payer: Self-pay | Admitting: Cardiovascular Disease

## 2016-08-28 VITALS — BP 100/72 | HR 77 | Ht 67.0 in | Wt 151.0 lb

## 2016-08-28 DIAGNOSIS — R0789 Other chest pain: Secondary | ICD-10-CM | POA: Insufficient documentation

## 2016-08-28 DIAGNOSIS — I9589 Other hypotension: Secondary | ICD-10-CM

## 2016-08-28 DIAGNOSIS — R002 Palpitations: Secondary | ICD-10-CM | POA: Insufficient documentation

## 2016-08-28 DIAGNOSIS — R5383 Other fatigue: Secondary | ICD-10-CM

## 2016-08-28 LAB — CBC
HCT: 42.1 % (ref 36.0–46.0)
HEMOGLOBIN: 14.4 g/dL (ref 12.0–15.0)
MCH: 31.6 pg (ref 26.0–34.0)
MCHC: 34.2 g/dL (ref 30.0–36.0)
MCV: 92.5 fL (ref 78.0–100.0)
Platelets: 191 10*3/uL (ref 150–400)
RBC: 4.55 MIL/uL (ref 3.87–5.11)
RDW: 12.1 % (ref 11.5–15.5)
WBC: 3.8 10*3/uL — ABNORMAL LOW (ref 4.0–10.5)

## 2016-08-28 LAB — BASIC METABOLIC PANEL
Anion gap: 7 (ref 5–15)
BUN: 18 mg/dL (ref 6–20)
CHLORIDE: 106 mmol/L (ref 101–111)
CO2: 24 mmol/L (ref 22–32)
Calcium: 9.6 mg/dL (ref 8.9–10.3)
Creatinine, Ser: 0.81 mg/dL (ref 0.44–1.00)
GFR calc non Af Amer: >60 mL/min (ref >60)
Glucose, Bld: 96 mg/dL (ref 65–99)
Potassium: 4.4 mmol/L (ref 3.5–5.1)
Sodium: 137 mmol/L (ref 135–145)

## 2016-08-28 LAB — CORTISOL: Cortisol, Plasma: 9.5 ug/dL

## 2016-08-28 LAB — TSH: TSH: 3.168 u[IU]/mL (ref 0.350–4.500)

## 2016-08-28 LAB — MAGNESIUM: Magnesium: 1.8 mg/dL (ref 1.7–2.4)

## 2016-08-28 NOTE — Progress Notes (Signed)
CARDIOLOGY CONSULT NOTE  Patient ID: Annette Vance MRN: [id number] DOB/AGE: 1956-01-12 61 y.o.  Admit date: (Not on file) Primary Physician: Purvis Kilts, MD Referring Physician:   Reason for Consultation: palpitations, chest discomfort  HPI: 61 yr old woman with h/o GERD and esophageal dilatation presents for evaluation of chest pain and palpitations.   She says that for the past 2 months, she has experienced "light fluttering sensation" in the upper left part of her chest radiating into the left side of her neck. She exercises routinely and uses DVD's in the morning, walks a lot, and golfs. For the past few months, she has been unable to complete her exercise DVD's and feels fatigued. She denies exertional chest pain and shortness of breath, as well as leg swelling, orthopnea, and syncope.  Drinks 1 cup of coffee daily.  She has had mild left sided chest tightness. If she tries to exercise vigorously, she gets lightheaded.  She recently retired. Denies a history of anxiety.   BP at the [facility] in recent past was 80/42. Denied dizziness then. Says her BP is historically low.  ECG 07/20/16 was normal.   Fam Hx: Father had pacemaker in mid 24's.    No Known Allergies  Current Outpatient Prescriptions  Medication Sig Dispense Refill  . lactobacillus acidophilus (BACID) TABS tablet Take 2 tablets by mouth 3 (three) times daily.     No current facility-administered medications for this visit.     Past Medical History:  Diagnosis Date  . Bradycardia   . Chest discomfort   . Dysphagia   . GERD (gastroesophageal reflux disease)   . Heart palpitations     Past Surgical History:  Procedure Laterality Date  . BALLOON DILATION N/A 12/25/2013   Procedure: BALLOON DILATION;  Surgeon: Rogene Houston, MD;  [facility];  Service: Endoscopy;  Laterality: N/A;  . BIOPSY  12/25/2013   Procedure: BIOPSY;  Surgeon: Rogene Houston, MD;  [facility];  Service: Endoscopy;;  . COLONOSCOPY N/A 07/29/2014   Procedure: COLONOSCOPY;  Surgeon: Rogene Houston, MD;  [facility];  Service: Endoscopy;  Laterality: N/A;  1025 - moved to 9:25 - Ann to notify pt  . ESOPHAGOGASTRODUODENOSCOPY N/A 12/25/2013   Procedure: ESOPHAGOGASTRODUODENOSCOPY (EGD);  Surgeon: Rogene Houston, MD;  [facility];  Service: Endoscopy;  Laterality: N/A;  1255  . KNEE SURGERY     1996  . WRIST SURGERY     ?    Social History   Social History  . Marital status: Divorced    Spouse name: N/A  . Number of children: 1  . Years of education: N/A   Occupational History  . retired    Social History Main Topics  . Smoking status: Never Smoker  . Smokeless tobacco: Never Used  . Alcohol use Yes     Comment: occasional  . Drug use: No  . Sexual activity: Not on file   Other Topics Concern  . Not on file   Social History Narrative  . No narrative on file       Prior to Admission medications   Medication Sig Start Date End Date Taking? Authorizing Provider  lactobacillus acidophilus (BACID) TABS tablet Take 2 tablets by mouth 3 (three) times daily.    Historical Provider, MD  pantoprazole (PROTONIX) 40 MG tablet TAKE 1 TABLET (40 MG TOTAL) BY MOUTH DAILY. 02/01/15   Rogene Houston,  MD     Review of systems complete and found to be negative unless listed above in HPI     Physical exam Blood pressure 100/72, pulse 77, height 5\' 7"  (1.702 m), weight 151 lb (68.5 kg), SpO2 97 %. General: NAD Neck: No JVD, no thyromegaly or thyroid nodule.  Lungs: Clear to auscultation bilaterally with normal respiratory effort. CV: Nondisplaced PMI. Regular rate and rhythm, normal S1/S2, no S3/S4, no murmur.  No peripheral edema.  No carotid bruit.  Normal pedal pulses.  Abdomen: Soft, nontender, no hepatosplenomegaly, no distention. +bowel sounds. Skin: Intact without lesions or rashes.  Neurologic: Alert and oriented x 3.  Psych: Normal  affect. Extremities: No clubbing or cyanosis.  HEENT: Normal.   ECG: Most recent ECG reviewed.  Telemetry: Independently reviewed.  Labs:  No results found for: WBC, HGB, HCT, MCV, PLT No results for input(s): NA, K, CL, CO2, BUN, CREATININE, CALCIUM, PROT, BILITOT, ALKPHOS, ALT, AST, GLUCOSE in the last 168 hours.  Invalid input(s): LABALBU No results found for: CKTOTAL, CKMB, CKMBINDEX, TROPONINI No results found for: CHOL No results found for: HDL No results found for: LDLCALC No results found for: TRIG No results found for: CHOLHDL No results found for: LDLDIRECT       Studies: No results found.  ASSESSMENT AND PLAN:  1. Palpitations: Will obtain one week event monitor. Will check TSH, Mg, BMET, CBC, and random cortisol.  2. Hypotension and fatigue: Will check TSH and random cortisol.  3. Chest tightness: Atypical symptoms for ischemic heart disease. Lacks traditional risk factors. Will obtain stress echocardiogram.  Dispo: fu 1 month.   Signed: Kate Sable, M.D., F.A.C.C.  08/28/2016, 9:02 AM

## 2016-08-28 NOTE — Patient Instructions (Signed)
Your physician recommends that you schedule a follow-up appointment in:  1 month   Your physician has recommended that you wear an event monitor. Event monitors are medical devices that record the heart's electrical activity. Doctors most often [place] these monitors to diagnose arrhythmias. Arrhythmias are problems with the speed or rhythm of the heartbeat. The monitor is a small, portable device. You can wear one while you do your normal daily activities. This is usually used to diagnose what is causing palpitations/syncope (passing out).    Your physician has requested that you have a stress echocardiogram. For further information please visit [email or web address]. Please follow instruction sheet as given.    Your physician recommends that you return for lab work in: today, cbc,bmet,tsh, cortisol,magnesium        Thank you for choosing [facility] !

## 2016-09-04 ENCOUNTER — Ambulatory Visit (HOSPITAL_COMMUNITY)
Admission: RE | Admit: 2016-09-04 | Discharge: 2016-09-04 | Disposition: A | Discharge status: Home / Self Care | Payer: Commercial Managed Care - HMO | Source: Ambulatory Visit | Attending: Cardiovascular Disease | Admitting: Cardiovascular Disease

## 2016-09-04 DIAGNOSIS — R0789 Other chest pain: Secondary | ICD-10-CM | POA: Diagnosis not present

## 2016-09-04 DIAGNOSIS — R002 Palpitations: Secondary | ICD-10-CM | POA: Diagnosis not present

## 2016-09-04 LAB — ECHOCARDIOGRAM STRESS TEST
CHL CUP MPHR: 158 {beats}/min
CSEPPHR: 151 {beats}/min
Estimated workload: 13.4 METS
Exercise duration (min): 10 min
Exercise duration (sec): 23 s
Percent HR: 95 %
RPE: 15
Rest HR: 73 {beats}/min

## 2016-09-04 NOTE — Progress Notes (Signed)
*  PRELIMINARY RESULTS* Echocardiogram Echocardiogram Stress Test has been performed.  Annette Vance 09/04/2016, 9:35 AM

## 2016-09-22 ENCOUNTER — Ambulatory Visit (INDEPENDENT_AMBULATORY_CARE_PROVIDER_SITE_OTHER): Payer: Commercial Managed Care - HMO

## 2016-09-22 DIAGNOSIS — R0789 Other chest pain: Secondary | ICD-10-CM

## 2016-09-22 DIAGNOSIS — R002 Palpitations: Secondary | ICD-10-CM | POA: Diagnosis not present

## 2016-09-27 ENCOUNTER — Telehealth: Payer: Self-pay

## 2016-09-27 NOTE — Telephone Encounter (Signed)
Received "courtesy call " from [employer] [facility] that patient had a "delayed transmission" from yesterday 09/26/16 at 3:11 pm EST but says recorded today at 1:28 pm. Patient activated monitor for dizziness and syncope. They could not reach the patient. I called her phone and got answering machine and left message for her to call back.    In reviewing patient event report in [employer] , strip was documented  NSR with artifact.     Will FYI Dr Bronson Ing

## 2016-09-28 NOTE — Telephone Encounter (Signed)
Received fax from [facility] with comments that they spoke with patient, "she did not pass out" , she was asymptomatic, outside playing golf.     I will FYI Dr Bronson Ing

## 2016-10-02 DIAGNOSIS — I87391 Chronic venous hypertension (idiopathic) with other complications of right lower extremity: Secondary | ICD-10-CM | POA: Diagnosis not present

## 2016-10-04 DIAGNOSIS — G8929 Other chronic pain: Secondary | ICD-10-CM | POA: Diagnosis not present

## 2016-10-04 DIAGNOSIS — M25512 Pain in left shoulder: Secondary | ICD-10-CM | POA: Diagnosis not present

## 2016-10-06 DIAGNOSIS — J300 Vasomotor rhinitis: Secondary | ICD-10-CM | POA: Diagnosis not present

## 2016-10-06 DIAGNOSIS — J302 Other seasonal allergic rhinitis: Secondary | ICD-10-CM | POA: Diagnosis not present

## 2016-10-06 DIAGNOSIS — Z6823 Body mass index (BMI) 23.0-23.9, adult: Secondary | ICD-10-CM | POA: Diagnosis not present

## 2016-10-09 ENCOUNTER — Encounter: Payer: Self-pay | Admitting: Cardiovascular Disease

## 2016-10-09 ENCOUNTER — Ambulatory Visit (INDEPENDENT_AMBULATORY_CARE_PROVIDER_SITE_OTHER): Payer: Commercial Managed Care - HMO | Admitting: Cardiovascular Disease

## 2016-10-09 VITALS — BP 102/60 | HR 100 | Ht 67.0 in | Wt 151.0 lb

## 2016-10-09 DIAGNOSIS — R002 Palpitations: Secondary | ICD-10-CM

## 2016-10-09 DIAGNOSIS — Z136 Encounter for screening for cardiovascular disorders: Secondary | ICD-10-CM | POA: Diagnosis not present

## 2016-10-09 DIAGNOSIS — R0789 Other chest pain: Secondary | ICD-10-CM

## 2016-10-09 DIAGNOSIS — R5383 Other fatigue: Secondary | ICD-10-CM

## 2016-10-09 DIAGNOSIS — IMO0001 Reserved for inherently not codable concepts without codable children: Secondary | ICD-10-CM

## 2016-10-09 MED ORDER — METOPROLOL TARTRATE 25 MG PO TABS: ORAL_TABLET | ORAL | 3 refills | Status: DC

## 2016-10-09 NOTE — Progress Notes (Signed)
SUBJECTIVE: The patient returns for follow-up after undergoing cardiovascular testing performed for the evaluation of chest pain and palpitations.  Event monitoring showed sinus rhythm with PACs.  TSH, basic metabolic panel, hemoglobin, magnesium, and cortisol levels were normal.  Stress echocardiogram was negative for inducible ischemia.  She has had no further episodes of exertional chest tightness.  She has seldom had palpitations, which primarily bothered her around Christmas time. She wonders if she was internalizing stress.  Review of Systems: As per "subjective", otherwise negative.  No Known Allergies  Current Outpatient Prescriptions  Medication Sig Dispense Refill  . lactobacillus acidophilus (BACID) TABS tablet Take 2 tablets by mouth 3 (three) times daily.     No current facility-administered medications for this visit.     Past Medical History:  Diagnosis Date  . Bradycardia   . Chest discomfort   . Dysphagia   . GERD (gastroesophageal reflux disease)   . Heart palpitations     Past Surgical History:  Procedure Laterality Date  . BALLOON DILATION N/A 12/25/2013   Procedure: BALLOON DILATION;  Surgeon: Rogene Houston, MD;  [facility];  Service: Endoscopy;  Laterality: N/A;  . BIOPSY  12/25/2013   Procedure: BIOPSY;  Surgeon: Rogene Houston, MD;  [facility];  Service: Endoscopy;;  . COLONOSCOPY N/A 07/29/2014   Procedure: COLONOSCOPY;  Surgeon: Rogene Houston, MD;  [facility];  Service: Endoscopy;  Laterality: N/A;  1025 - moved to 9:25 - Ann to notify pt  . ESOPHAGOGASTRODUODENOSCOPY N/A 12/25/2013   Procedure: ESOPHAGOGASTRODUODENOSCOPY (EGD);  Surgeon: Rogene Houston, MD;  [facility];  Service: Endoscopy;  Laterality: N/A;  1255  . KNEE SURGERY     1996  . WRIST SURGERY     ?    Social History   Social History  . Marital status: Divorced    Spouse name: N/A  . Number of children: 1  . Years  of education: N/A   Occupational History  . retired    Social History Main Topics  . Smoking status: Never Smoker  . Smokeless tobacco: Never Used  . Alcohol use Yes     Comment: occasional  . Drug use: No  . Sexual activity: Not on file   Other Topics Concern  . Not on file   Social History Narrative  . No narrative on file     Vitals:   10/09/16 1542  BP: 102/60  Pulse: 100  SpO2: 96%  Weight: 151 lb (68.5 kg)  Height: 5\' 7"  (1.702 m)    Wt Readings from Last 3 Encounters:  10/09/16 151 lb (68.5 kg)  08/28/16 151 lb (68.5 kg)  07/29/14 152 lb (68.9 kg)     PHYSICAL EXAM General: NAD HEENT: Normal. Neck: No JVD, no thyromegaly. Lungs: Clear to auscultation bilaterally with normal respiratory effort. CV: Nondisplaced PMI.  Regular rate and rhythm, normal S1/S2, no S3/S4, no murmur. No pretibial or periankle edema.   Abdomen: Soft, nontender, no distention.  Neurologic: Alert and oriented.  Psych: Normal affect. Skin: Normal. Musculoskeletal: No gross deformities.    ECG: Most recent ECG reviewed.   Labs: Lab Results  Component Value Date/Time   K 4.4 08/28/2016 09:07 AM   BUN 18 08/28/2016 09:07 AM   CREATININE 0.81 08/28/2016 09:07 AM   TSH 3.168 08/28/2016 09:07 AM   HGB 14.4 08/28/2016 09:07 AM     Lipids: No results found for: LDLCALC, LDLDIRECT, CHOL,  TRIG, HDL     ASSESSMENT AND PLAN:  1. Palpitations: Studies reviewed above. Sinus rhythm with PACs seen with event monitoring. I will prescribe metoprolol tartrate 12.5 mg as needed.  2. Hypotension and fatigue: Normal blood tests as reviewed above. Blood pressure is normal today.  3. Chest tightness: Negative stress echocardiogram for inducible ischemia. No further testing is indicated at this time. She is symptomatically stable from this standpoint.   Disposition: Follow up prn.  Kate Sable, M.D., F.A.C.C.

## 2016-10-09 NOTE — Patient Instructions (Addendum)
Your physician wants you to follow-up in:  As needed  Take Metoprolol 25 mg daily as needed for palpitations   Thank you for choosing [facility] !

## 2016-10-16 DIAGNOSIS — G8929 Other chronic pain: Secondary | ICD-10-CM | POA: Diagnosis not present

## 2016-10-16 DIAGNOSIS — M25512 Pain in left shoulder: Secondary | ICD-10-CM | POA: Diagnosis not present

## 2016-10-19 DIAGNOSIS — I87391 Chronic venous hypertension (idiopathic) with other complications of right lower extremity: Secondary | ICD-10-CM | POA: Diagnosis not present

## 2016-10-20 DIAGNOSIS — M19012 Primary osteoarthritis, left shoulder: Secondary | ICD-10-CM | POA: Diagnosis not present

## 2016-10-20 DIAGNOSIS — M25512 Pain in left shoulder: Secondary | ICD-10-CM | POA: Diagnosis not present

## 2016-10-20 DIAGNOSIS — G8929 Other chronic pain: Secondary | ICD-10-CM | POA: Diagnosis not present

## 2017-01-09 DIAGNOSIS — S161XXA Strain of muscle, fascia and tendon at neck level, initial encounter: Secondary | ICD-10-CM | POA: Diagnosis not present

## 2017-01-09 DIAGNOSIS — M546 Pain in thoracic spine: Secondary | ICD-10-CM | POA: Diagnosis not present

## 2017-01-09 DIAGNOSIS — S335XXA Sprain of ligaments of lumbar spine, initial encounter: Secondary | ICD-10-CM | POA: Diagnosis not present

## 2017-01-11 DIAGNOSIS — S335XXA Sprain of ligaments of lumbar spine, initial encounter: Secondary | ICD-10-CM | POA: Diagnosis not present

## 2017-01-11 DIAGNOSIS — M546 Pain in thoracic spine: Secondary | ICD-10-CM | POA: Diagnosis not present

## 2017-01-11 DIAGNOSIS — S161XXA Strain of muscle, fascia and tendon at neck level, initial encounter: Secondary | ICD-10-CM | POA: Diagnosis not present

## 2017-01-29 DIAGNOSIS — M7541 Impingement syndrome of right shoulder: Secondary | ICD-10-CM | POA: Diagnosis not present

## 2017-05-08 DIAGNOSIS — Z01419 Encounter for gynecological examination (general) (routine) without abnormal findings: Secondary | ICD-10-CM | POA: Diagnosis not present

## 2017-06-01 DIAGNOSIS — H2512 Age-related nuclear cataract, left eye: Secondary | ICD-10-CM | POA: Diagnosis not present

## 2017-06-01 DIAGNOSIS — L821 Other seborrheic keratosis: Secondary | ICD-10-CM | POA: Diagnosis not present

## 2017-06-01 DIAGNOSIS — H04123 Dry eye syndrome of bilateral lacrimal glands: Secondary | ICD-10-CM | POA: Diagnosis not present

## 2017-06-01 DIAGNOSIS — L570 Actinic keratosis: Secondary | ICD-10-CM | POA: Diagnosis not present

## 2017-06-01 DIAGNOSIS — L299 Pruritus, unspecified: Secondary | ICD-10-CM | POA: Diagnosis not present

## 2017-06-01 DIAGNOSIS — H25811 Combined forms of age-related cataract, right eye: Secondary | ICD-10-CM | POA: Diagnosis not present

## 2017-07-25 DIAGNOSIS — M7541 Impingement syndrome of right shoulder: Secondary | ICD-10-CM | POA: Diagnosis not present

## 2017-07-25 DIAGNOSIS — M7501 Adhesive capsulitis of right shoulder: Secondary | ICD-10-CM | POA: Diagnosis not present

## 2017-07-26 DIAGNOSIS — M25611 Stiffness of right shoulder, not elsewhere classified: Secondary | ICD-10-CM | POA: Diagnosis not present

## 2017-07-26 DIAGNOSIS — M79601 Pain in right arm: Secondary | ICD-10-CM | POA: Diagnosis not present

## 2017-07-26 DIAGNOSIS — M25511 Pain in right shoulder: Secondary | ICD-10-CM | POA: Diagnosis not present

## 2017-07-31 DIAGNOSIS — M25511 Pain in right shoulder: Secondary | ICD-10-CM | POA: Diagnosis not present

## 2017-07-31 DIAGNOSIS — M25611 Stiffness of right shoulder, not elsewhere classified: Secondary | ICD-10-CM | POA: Diagnosis not present

## 2017-07-31 DIAGNOSIS — M79601 Pain in right arm: Secondary | ICD-10-CM | POA: Diagnosis not present

## 2017-08-02 DIAGNOSIS — M25611 Stiffness of right shoulder, not elsewhere classified: Secondary | ICD-10-CM | POA: Diagnosis not present

## 2017-08-02 DIAGNOSIS — M79601 Pain in right arm: Secondary | ICD-10-CM | POA: Diagnosis not present

## 2017-08-02 DIAGNOSIS — M25511 Pain in right shoulder: Secondary | ICD-10-CM | POA: Diagnosis not present

## 2017-08-07 DIAGNOSIS — M79601 Pain in right arm: Secondary | ICD-10-CM | POA: Diagnosis not present

## 2017-08-07 DIAGNOSIS — M25511 Pain in right shoulder: Secondary | ICD-10-CM | POA: Diagnosis not present

## 2017-08-07 DIAGNOSIS — M25611 Stiffness of right shoulder, not elsewhere classified: Secondary | ICD-10-CM | POA: Diagnosis not present

## 2017-08-09 DIAGNOSIS — M25511 Pain in right shoulder: Secondary | ICD-10-CM | POA: Diagnosis not present

## 2017-08-09 DIAGNOSIS — M25611 Stiffness of right shoulder, not elsewhere classified: Secondary | ICD-10-CM | POA: Diagnosis not present

## 2017-08-09 DIAGNOSIS — M79601 Pain in right arm: Secondary | ICD-10-CM | POA: Diagnosis not present

## 2017-08-13 DIAGNOSIS — M25611 Stiffness of right shoulder, not elsewhere classified: Secondary | ICD-10-CM | POA: Diagnosis not present

## 2017-08-13 DIAGNOSIS — M79601 Pain in right arm: Secondary | ICD-10-CM | POA: Diagnosis not present

## 2017-08-13 DIAGNOSIS — M25511 Pain in right shoulder: Secondary | ICD-10-CM | POA: Diagnosis not present

## 2017-08-16 DIAGNOSIS — M25511 Pain in right shoulder: Secondary | ICD-10-CM | POA: Diagnosis not present

## 2017-08-16 DIAGNOSIS — M79601 Pain in right arm: Secondary | ICD-10-CM | POA: Diagnosis not present

## 2017-08-16 DIAGNOSIS — M25611 Stiffness of right shoulder, not elsewhere classified: Secondary | ICD-10-CM | POA: Diagnosis not present

## 2017-08-20 DIAGNOSIS — M25611 Stiffness of right shoulder, not elsewhere classified: Secondary | ICD-10-CM | POA: Diagnosis not present

## 2017-08-20 DIAGNOSIS — M25511 Pain in right shoulder: Secondary | ICD-10-CM | POA: Diagnosis not present

## 2017-08-20 DIAGNOSIS — M79601 Pain in right arm: Secondary | ICD-10-CM | POA: Diagnosis not present

## 2017-08-23 DIAGNOSIS — M25611 Stiffness of right shoulder, not elsewhere classified: Secondary | ICD-10-CM | POA: Diagnosis not present

## 2017-08-23 DIAGNOSIS — M79601 Pain in right arm: Secondary | ICD-10-CM | POA: Diagnosis not present

## 2017-08-23 DIAGNOSIS — M25511 Pain in right shoulder: Secondary | ICD-10-CM | POA: Diagnosis not present

## 2017-09-06 DIAGNOSIS — Z6825 Body mass index (BMI) 25.0-25.9, adult: Secondary | ICD-10-CM | POA: Diagnosis not present

## 2017-09-06 DIAGNOSIS — L0390 Cellulitis, unspecified: Secondary | ICD-10-CM | POA: Diagnosis not present

## 2017-09-10 DIAGNOSIS — M79641 Pain in right hand: Secondary | ICD-10-CM | POA: Diagnosis not present

## 2017-09-10 DIAGNOSIS — M720 Palmar fascial fibromatosis [Dupuytren]: Secondary | ICD-10-CM | POA: Diagnosis not present

## 2017-09-24 DIAGNOSIS — M25512 Pain in left shoulder: Secondary | ICD-10-CM | POA: Diagnosis not present

## 2017-09-24 DIAGNOSIS — Z6824 Body mass index (BMI) 24.0-24.9, adult: Secondary | ICD-10-CM | POA: Diagnosis not present

## 2017-10-02 DIAGNOSIS — M19012 Primary osteoarthritis, left shoulder: Secondary | ICD-10-CM | POA: Diagnosis not present

## 2017-10-02 DIAGNOSIS — M25512 Pain in left shoulder: Secondary | ICD-10-CM | POA: Diagnosis not present

## 2017-12-12 DIAGNOSIS — T07XXXA Unspecified multiple injuries, initial encounter: Secondary | ICD-10-CM | POA: Diagnosis not present

## 2017-12-12 DIAGNOSIS — Z6824 Body mass index (BMI) 24.0-24.9, adult: Secondary | ICD-10-CM | POA: Diagnosis not present

## 2017-12-12 DIAGNOSIS — L299 Pruritus, unspecified: Secondary | ICD-10-CM | POA: Diagnosis not present

## 2017-12-12 DIAGNOSIS — L539 Erythematous condition, unspecified: Secondary | ICD-10-CM | POA: Diagnosis not present

## 2017-12-25 DIAGNOSIS — M19012 Primary osteoarthritis, left shoulder: Secondary | ICD-10-CM | POA: Diagnosis not present

## 2017-12-25 DIAGNOSIS — M25512 Pain in left shoulder: Secondary | ICD-10-CM | POA: Diagnosis not present

## 2018-01-08 DIAGNOSIS — M25512 Pain in left shoulder: Secondary | ICD-10-CM | POA: Diagnosis not present

## 2018-01-18 DIAGNOSIS — M25512 Pain in left shoulder: Secondary | ICD-10-CM | POA: Diagnosis not present

## 2018-02-21 DIAGNOSIS — Z0001 Encounter for general adult medical examination with abnormal findings: Secondary | ICD-10-CM | POA: Diagnosis not present

## 2018-02-21 DIAGNOSIS — Z6823 Body mass index (BMI) 23.0-23.9, adult: Secondary | ICD-10-CM | POA: Diagnosis not present

## 2018-02-21 DIAGNOSIS — Z0000 Encounter for general adult medical examination without abnormal findings: Secondary | ICD-10-CM | POA: Diagnosis not present

## 2018-05-22 DIAGNOSIS — K648 Other hemorrhoids: Secondary | ICD-10-CM | POA: Diagnosis not present

## 2018-05-22 DIAGNOSIS — K625 Hemorrhage of anus and rectum: Secondary | ICD-10-CM | POA: Diagnosis not present

## 2018-05-22 DIAGNOSIS — E663 Overweight: Secondary | ICD-10-CM | POA: Diagnosis not present

## 2018-06-04 ENCOUNTER — Ambulatory Visit: Payer: 59 | Admitting: General Surgery

## 2018-06-04 ENCOUNTER — Encounter: Payer: Self-pay | Admitting: General Surgery

## 2018-06-04 VITALS — BP 117/69 | HR 83 | Temp 97.8°F | Resp 18 | Wt 154.0 lb

## 2018-06-04 DIAGNOSIS — K642 Third degree hemorrhoids: Secondary | ICD-10-CM | POA: Diagnosis not present

## 2018-06-04 DIAGNOSIS — K644 Residual hemorrhoidal skin tags: Secondary | ICD-10-CM | POA: Diagnosis not present

## 2018-06-04 NOTE — Progress Notes (Signed)
[facility] History and Physical  Reason for Referral: Hemorrhoids  Referring Physician:  Dr. Hilma Favors (PCP)   Chief Complaint    Hemorrhoids      KENYANNA GRZESIAK is a 62 y.o. female.  HPI: Ms. Alsteen is a 62 yo who has a history of GERD who has had chronic hemorrhoids since the birth of her children.  She is worried that these have been getting larger and have been causing her more discomfort in the last few months. She says that she is very active and golf's / exercises and notices that if she is on her feet for extended periods of time the hemorrhoids stick out and she has to push them back into her anus. This is uncomfortable and she notices a bulge in her anal area.  She reports only scant/ ocassional bleeding and says that her stools are otherwise soft and regular.  She had "laser surgery" with Dr. Romona Curls in the past for her hemorrhoids and that she was somewhat better at that time. Since then she feels that she has started to develops symptoms again, and wonders if she has developed more hemorrhoids.  She says that the discomfort is present but she does not really have any issues with hygiene and minimal bleeding. She uses some creams but this does not help with her complaints. Pushing the hemorrhoids back into the anus helps to relieve her discomfort.   She had a colonoscopy in 2016 with Dr.Rehman where he noted a redundant colon and prominent hemorrhoids.   Past Medical History:  Diagnosis Date  . Bradycardia   . Chest discomfort   . Dysphagia   . GERD (gastroesophageal reflux disease)   . Heart palpitations     Past Surgical History:  Procedure Laterality Date  . BALLOON DILATION N/A 12/25/2013   Procedure: BALLOON DILATION;  Surgeon: Rogene Houston, MD;  [facility];  Service: Endoscopy;  Laterality: N/A;  . BIOPSY  12/25/2013   Procedure: BIOPSY;  Surgeon: Rogene Houston, MD;  [facility];  Service: Endoscopy;;  . COLONOSCOPY N/A  07/29/2014   Procedure: COLONOSCOPY;  Surgeon: Rogene Houston, MD;  [facility];  Service: Endoscopy;  Laterality: N/A;  1025 - moved to 9:25 - Ann to notify pt  . ESOPHAGOGASTRODUODENOSCOPY N/A 12/25/2013   Procedure: ESOPHAGOGASTRODUODENOSCOPY (EGD);  Surgeon: Rogene Houston, MD;  [facility];  Service: Endoscopy;  Laterality: N/A;  1255  . HEMORRHOID SURGERY     Laser Surgery with Dr. Romona Curls  . KNEE SURGERY     1996  . WRIST SURGERY     ?    Family History  Problem Relation Age of Onset  . Dementia Father   . Stroke Paternal Grandfather   . Hypertension Paternal Grandfather   . Diabetes Brother     Social History   Tobacco Use  . Smoking status: Never Smoker  . Smokeless tobacco: Never Used  Substance Use Topics  . Alcohol use: Yes    Comment: occasional  . Drug use: No    Medications: I have reviewed the patient's current medications. Allergies as of 06/04/2018   No Known Allergies     Medication List       Accurate as of June 04, 2018 11:59 PM. Always use your most recent med list.        lactobacillus acidophilus Tabs tablet Take 2 tablets by mouth 3 (three) times daily.   omega-3 acid ethyl esters 1  g capsule Commonly known as:  LOVAZA Take by mouth 2 (two) times daily.        ROS:  A comprehensive review of systems was negative except for: Gastrointestinal: positive for hemorrhoids that prolapse Musculoskeletal: positive for stiff joints Allergic/Immunologic: positive for hay fever  Blood pressure 117/69, pulse 83, temperature 97.8 F (36.6 C), temperature source Temporal, resp. rate 18, weight 154 lb (69.9 kg). Physical Exam Vitals signs reviewed.  Constitutional:      Appearance: Normal appearance.  HENT:     Head: Normocephalic.     Nose: Nose normal.     Mouth/Throat:     Mouth: Mucous membranes are moist.  Eyes:     Extraocular Movements: Extraocular movements intact.     Pupils: Pupils are equal, round,  and reactive to light.  Neck:     Musculoskeletal: Normal range of motion.  Cardiovascular:     Rate and Rhythm: Normal rate and regular rhythm.  Pulmonary:     Effort: Pulmonary effort is normal.     Breath sounds: Normal breath sounds.  Abdominal:     General: Abdomen is flat. There is no distension.     Tenderness: There is no abdominal tenderness.  Genitourinary:    Rectum: External hemorrhoid and internal hemorrhoid present. Normal anal tone.     Comments: Normal tone, prolapsing three columns of hemorrhoids that are present on exam, can push internally, some external skin tags surrounding, no gross blood Musculoskeletal: Normal range of motion.        General: No swelling.  Skin:    General: Skin is warm and dry.  Neurological:     General: No focal deficit present.     Mental Status: She is alert and oriented to person, place, and time.  Psychiatric:        Mood and Affect: Mood normal.        Behavior: Behavior normal.        Thought Content: Thought content normal.        Judgment: Judgment normal.     Results: None   Assessment & Plan:  MALONI MUSLEH is a 62 y.o. female with third degree prolapsing hemorrhoids. She had a "laser hemorrhoid" surgery in the past and reports some improvement. She now has more hemorrhoids and has prolapse with external skin tags surrounding the anus.  Given this she will require a surgical hemorrhoidectomy to correct the issues and remove the hemorrhoid and skin.   Hemorrhoid surgery for external hemorrhoids is very painful. The pain and discomfort that the patient is having currently will be magnified after the surgery for at least 2-3 weeks.  The patient will have feelings of constant pressure and pain in the area from the swelling and removal of the anoderm (skin around the anus). The internal hemorrhoids are not painful to remove because the same nerves are not involved, and the sensation is different, but removal of any external  hemorrhoids will cause significant discomfort. They will need at least 4-6 weeks to recover from the surgery, and should not expect to be able to feel back to "normal for 6-8 weeks."    The risk of hemorrhoid surgery include bleeding, risk of infection although rare, and the risk of narrowing the anal canal if too much tissue is removed. Given this risk, it is likely that only the 2 largest hemorrhoid columns would be removed during the initial surgery.  We have also discussed the risk of incontinence after surgery if the muscles  were injured, and although this is rare that it can happen and is another reason to limit the amount of hemorrhoids removed.    Given the holidays and the extent of surgery, the patient wants to think about her options and decide when / if she wants to pursue surgery. At this time, we discussed that this will not get better, but that this is not an emergency.  She is going to call us once she has decided.   All questions were answered to the satisfaction of the patient.   Virl Cagey 06/06/2018, 12:56 PM

## 2018-06-04 NOTE — Patient Instructions (Signed)
Surgical Procedures for Hemorrhoids Surgical procedures can be used to treat hemorrhoids. Hemorrhoids are swollen veins that are inside the rectum (internal hemorrhoids) or around the anus (external hemorrhoids). They are caused by increased pressure in the anal area. This pressure may result from straining to have a bowel movement (constipation), diarrhea, pregnancy, obesity, anal sex, or sitting for long periods of time. Hemorrhoids can cause symptoms such as pain and bleeding. Surgery may be needed if diet changes, lifestyle changes, and other treatments do not help your symptoms. Various surgical methods may be used. Three common methods are:  Closed hemorrhoidectomy. The hemorrhoids are surgically removed, and the surgical cuts (incisions) are closed with stitches (sutures).  Open hemorrhoidectomy. The hemorrhoids are surgically removed, but the incisions are allowed to heal without sutures.  Stapled hemorrhoidopexy. The hemorrhoids are removed using a device that takes out a ring of excess tissue.  Tell a health care provider about:  Any allergies you have.  All medicines you are taking, including vitamins, herbs, eye drops, creams, and over-the-counter medicines.  Any problems you or family members have had with anesthetic medicines.  Any blood disorders you have.  Any surgeries you have had.  Any medical conditions you have.  Whether you are pregnant or may be pregnant. What are the risks? Generally, this is a safe procedure. However, problems may occur, including:  Infection.  Bleeding.  Allergic reactions to medicines.  Damage to other structures or organs.  Pain.  Constipation.  Difficulty passing urine.  Narrowing of the anal canal (stenosis).  Difficulty controlling bowel movements (incontinence).  What happens before the procedure?  Ask your health care provider about: ? Changing or stopping your regular medicines. This is especially important if you are  taking diabetes medicines or blood thinners. ? Taking medicines such as aspirin and ibuprofen. These medicines can thin your blood. Do not take these medicines before your procedure if your health care provider instructs you not to.  You may need to have a procedure to examine the inside of your colon with a scope (colonoscopy). Your health care provider may do this to make sure that there are no other causes for your bleeding or pain.  Follow instructions from your health care provider about eating or drinking restrictions.  You may be instructed to take a laxative and an enema to clean out your colon before surgery (bowel prep). Carefully follow instructions from your health care provider about bowel prep.  Ask your health care provider how your surgical site will be marked or identified.  You may be given antibiotic medicine to help prevent infection.  Plan to have someone take you home after the procedure. What happens during the procedure?  To reduce your risk of infection: ? Your health care team will wash or sanitize their hands. ? Your skin will be washed with soap.  An IV tube will be inserted into one of your veins.  You will be given one or more of the following: ? A medicine to help you relax (sedative). ? A medicine to numb the area (local anesthetic). ? A medicine to make you fall asleep (general anesthetic). ? A medicine that is injected into an area of your body to numb everything below the injection site (regional anesthetic).  A lubricating jelly may be placed into your rectum.  Your surgeon will insert a short scope (anoscope) into your rectum to examine the hemorrhoids.  Open Hemorrhoidectomy  The hemorrhoids will be removed with surgical instruments.  The incisions  will be left open to heal without sutures.  What happens after the procedure?  Your blood pressure, heart rate, breathing rate, and blood oxygen level will be monitored often until the medicines  you were given have worn off.  You will be given pain medicine as needed. This information is not intended to replace advice given to you by your health care provider. Make sure you discuss any questions you have with your health care provider. Document Released: 04/02/2009 Document Revised: 11/11/2015 Document Reviewed: 08/31/2014 Elsevier Interactive Patient Education  2018 [name]. Hemorrhoids Hemorrhoids are swollen veins in and around the rectum or anus. Hemorrhoids can cause pain, itching, or bleeding. Most of the time, they do not cause serious problems. They usually get better with diet changes, lifestyle changes, and other home treatments. Follow these instructions at home: Eating and drinking  Eat foods that have fiber, such as whole grains, beans, nuts, fruits, and vegetables. Ask your doctor about taking products that have added fiber (fibersupplements).  Drink enough fluid to keep your pee (urine) clear or pale yellow. For Pain and Swelling  Take a warm-water bath (sitz bath) for 20 minutes to ease pain. Do this 3-4 times a day.  If directed, put ice on the painful area. It may be helpful to use ice between your warm baths. ? Put ice in a plastic bag. ? Place a towel between your skin and the bag. ? Leave the ice on for 20 minutes, 2-3 times a day. General instructions  Take over-the-counter and prescription medicines only as told by your doctor. ? Medicated creams and medicines that are inserted into the anus (suppositories) may be used or applied as told.  Exercise often.  Go to the bathroom when you have the urge to poop (to have a bowel movement). Do not wait.  Avoid pushing too hard (straining) when you poop.  Keep the butt area dry and clean. Use wet toilet paper or moist paper towels.  Do not sit on the toilet for a long time. Contact a doctor if:  You have any of these: ? Pain and swelling that do not get better with treatment or  medicine. ? Bleeding that will not stop. ? Trouble pooping or you cannot poop. ? Pain or swelling outside the area of the hemorrhoids. This information is not intended to replace advice given to you by your health care provider. Make sure you discuss any questions you have with your health care provider. Document Released: 03/14/2008 Document Revised: 11/11/2015 Document Reviewed: 02/17/2015 Elsevier Interactive Patient Education  [employer].

## 2018-06-06 ENCOUNTER — Encounter: Payer: Self-pay | Admitting: General Surgery

## 2018-06-07 NOTE — H&P (Signed)
[facility] History and Physical  Reason for Referral: Hemorrhoids  Referring Physician:  Dr. Hilma Favors (PCP)      Chief Complaint    Hemorrhoids      Annette Vance is a 62 y.o. female.  HPI: Annette Vance is a 62 yo who has a history of GERD who has had chronic hemorrhoids since the birth of her children.  She is worried that these have been getting larger and have been causing her more discomfort in the last few months. She says that she is very active and golf's / exercises and notices that if she is on her feet for extended periods of time the hemorrhoids stick out and she has to push them back into her anus. This is uncomfortable and she notices a bulge in her anal area.  She reports only scant/ ocassional bleeding and says that her stools are otherwise soft and regular.  She had "laser surgery" with Dr. Romona Curls in the past for her hemorrhoids and that she was somewhat better at that time. Since then she feels that she has started to develops symptoms again, and wonders if she has developed more hemorrhoids.  She says that the discomfort is present but she does not really have any issues with hygiene and minimal bleeding. She uses some creams but this does not help with her complaints. Pushing the hemorrhoids back into the anus helps to relieve her discomfort.   She had a colonoscopy in 2016 with Dr.Rehman where he noted a redundant colon and prominent hemorrhoids.       Past Medical History:  Diagnosis Date  . Bradycardia   . Chest discomfort   . Dysphagia   . GERD (gastroesophageal reflux disease)   . Heart palpitations          Past Surgical History:  Procedure Laterality Date  . BALLOON DILATION N/A 12/25/2013   Procedure: BALLOON DILATION;  Surgeon: Rogene Houston, MD;  Location: [facility];  Service: Endoscopy;  Laterality: N/A;  . BIOPSY  12/25/2013   Procedure: BIOPSY;  Surgeon: Rogene Houston, MD;  Location: [facility];  Service:  Endoscopy;;  . COLONOSCOPY N/A 07/29/2014   Procedure: COLONOSCOPY;  Surgeon: Rogene Houston, MD;  Location: [facility];  Service: Endoscopy;  Laterality: N/A;  1025 - moved to 9:25 - Ann to notify pt  . ESOPHAGOGASTRODUODENOSCOPY N/A 12/25/2013   Procedure: ESOPHAGOGASTRODUODENOSCOPY (EGD);  Surgeon: Rogene Houston, MD;  Location: [facility];  Service: Endoscopy;  Laterality: N/A;  1255  . HEMORRHOID SURGERY     Laser Surgery with Dr. Romona Curls  . KNEE SURGERY     1996  . WRIST SURGERY     ?         Family History  Problem Relation Age of Onset  . Dementia Father   . Stroke Paternal Grandfather   . Hypertension Paternal Grandfather   . Diabetes Brother     Social History        Tobacco Use  . Smoking status: Never Smoker  . Smokeless tobacco: Never Used  Substance Use Topics  . Alcohol use: Yes    Comment: occasional  . Drug use: No    Medications: I have reviewed the patient's current medications. Allergies as of 06/04/2018   No Known Allergies        Medication List       Accurate as of June 04, 2018 11:59 PM. Always use your most recent med list.  lactobacillus acidophilus Tabs tablet Take 2 tablets by mouth 3 (three) times daily.   omega-3 acid ethyl esters 1 g capsule Commonly known as:  LOVAZA Take by mouth 2 (two) times daily.        ROS:  A comprehensive review of systems was negative except for: Gastrointestinal: positive for hemorrhoids that prolapse Musculoskeletal: positive for stiff joints Allergic/Immunologic: positive for hay fever  Blood pressure 117/69, pulse 83, temperature 97.8 F (36.6 C), temperature source Temporal, resp. rate 18, weight 154 lb (69.9 kg). Physical Exam Vitals signs reviewed.  Constitutional:      Appearance: Normal appearance.  HENT:     Head: Normocephalic.     Nose: Nose normal.     Mouth/Throat:     Mouth: Mucous membranes are moist.  Eyes:      Extraocular Movements: Extraocular movements intact.     Pupils: Pupils are equal, round, and reactive to light.  Neck:     Musculoskeletal: Normal range of motion.  Cardiovascular:     Rate and Rhythm: Normal rate and regular rhythm.  Pulmonary:     Effort: Pulmonary effort is normal.     Breath sounds: Normal breath sounds.  Abdominal:     General: Abdomen is flat. There is no distension.     Tenderness: There is no abdominal tenderness.  Genitourinary:    Rectum: External hemorrhoid and internal hemorrhoid present. Normal anal tone.     Comments: Normal tone, prolapsing three columns of hemorrhoids that are present on exam, can push internally, some external skin tags surrounding, no gross blood Musculoskeletal: Normal range of motion.        General: No swelling.  Skin:    General: Skin is warm and dry.  Neurological:     General: No focal deficit present.     Mental Status: She is alert and oriented to person, place, and time.  Psychiatric:        Mood and Affect: Mood normal.        Behavior: Behavior normal.        Thought Content: Thought content normal.        Judgment: Judgment normal.     Results: None   Assessment & Plan:  Annette Vance is a 62 y.o. female with third degree prolapsing hemorrhoids. She had a "laser hemorrhoid" surgery in the past and reports some improvement. She now has more hemorrhoids and has prolapse with external skin tags surrounding the anus.  Given this she will require a surgical hemorrhoidectomy to correct the issues and remove the hemorrhoid and skin.   Hemorrhoid surgery for external hemorrhoids is very painful. The pain and discomfort that the patient is having currently will be magnified after the surgery for at least 2-3 weeks.  The patient will have feelings of constant pressure and pain in the area from the swelling and removal of the anoderm (skin around the anus). The internal hemorrhoids are not painful to remove because the  same nerves are not involved, and the sensation is different, but removal of any external hemorrhoids will cause significant discomfort. They will need at least 4-6 weeks to recover from the surgery, and should not expect to be able to feel back to "normal for 6-8 weeks."    The risk of hemorrhoid surgery include bleeding, risk of infection although rare, and the risk of narrowing the anal canal if too much tissue is removed. Given this risk, it is likely that only the 2 largest hemorrhoid columns would  be removed during the initial surgery.  We have also discussed the risk of incontinence after surgery if the muscles were injured, and although this is rare that it can happen and is another reason to limit the amount of hemorrhoids removed.    Given the holidays and the extent of surgery, the patient wants to think about her options and decide when / if she wants to pursue surgery. At this time, we discussed that this will not get better, but that this is not an emergency.  She is going to call us once she has decided.   All questions were answered to the satisfaction of the patient.   Virl Cagey 06/06/2018, 12:56 PM

## 2018-06-26 NOTE — Patient Instructions (Signed)
Annette Vance  06/26/2018     @PREFPERIOPPHARMACY @   Your procedure is scheduled on  07/10/2018.  Report to [facility] at  700  A.M.  Call this number if you have problems the morning of surgery:  [phone]   Remember:  Do not eat or drink after midnight.                        Take these medicines the morning of surgery with A SIP OF WATER  None    Do not wear jewelry, make-up or nail polish.  Do not wear lotions, powders, or perfumes, or deodorant.  Do not shave 48 hours prior to surgery.  Men may shave face and neck.  Do not bring valuables to the hospital.  [facility] is not responsible for any belongings or valuables.  Contacts, dentures or bridgework may not be worn into surgery.  Leave your suitcase in the car.  After surgery it may be brought to your room.  For patients admitted to the hospital, discharge time will be determined by your treatment team.  Patients discharged the day of surgery will not be allowed to drive home.   Name and phone number of your driver:   family Special instructions:  None  Please read over the following fact sheets that you were given. Anesthesia Post-op Instructions and Care and Recovery After Surgery       Hemorrhoids Hemorrhoids are swollen veins that may develop:  In the butt (rectum). These are called internal hemorrhoids.  Around the opening of the butt (anus). These are called external hemorrhoids. Hemorrhoids can cause pain, itching, or bleeding. Most of the time, they do not cause serious problems. They usually get better with diet changes, lifestyle changes, and other home treatments. What are the causes? This condition may be caused by:  Having trouble pooping (constipation).  Pushing hard (straining) to poop.  Watery poop (diarrhea).  Pregnancy.  Being very overweight (obese).  Sitting for long periods of time.  Heavy lifting or other activity that causes you to strain.  Anal  sex.  Riding a bike for a long period of time. What are the signs or symptoms? Symptoms of this condition include:  Pain.  Itching or soreness in the butt.  Bleeding from the butt.  Leaking poop.  Swelling in the area.  One or more lumps around the opening of your butt. How is this diagnosed? A doctor can often diagnose this condition by looking at the affected area. The doctor may also:  Do an exam that involves feeling the area with a gloved hand (digital rectal exam).  Examine the area inside your butt using a small tube (anoscope).  Order blood tests. This may be done if you have lost a lot of blood.  Have you get a test that involves looking inside the colon using a flexible tube with a camera on the end (sigmoidoscopy or colonoscopy). How is this treated? This condition can usually be treated at home. Your doctor may tell you to change what you eat, make lifestyle changes, or try home treatments. If these do not help, procedures can be done to remove the hemorrhoids or make them smaller. These may involve:  Placing rubber bands at the base of the hemorrhoids to cut off their blood supply.  Injecting medicine into the hemorrhoids to shrink them.  Shining a type of light energy onto the  hemorrhoids to cause them to fall off.  Doing surgery to remove the hemorrhoids or cut off their blood supply. Follow these instructions at home: Eating and drinking   Eat foods that have a lot of fiber in them. These include whole grains, beans, nuts, fruits, and vegetables.  Ask your doctor about taking products that have added fiber (fibersupplements).  Reduce the amount of fat in your diet. You can do this by: ? Eating low-fat dairy products. ? Eating less red meat. ? Avoiding processed foods.  Drink enough fluid to keep your pee (urine) pale yellow. Managing pain and swelling   Take a warm-water bath (sitz bath) for 20 minutes to ease pain. Do this 3-4 times a day. You  may do this in a bathtub or using a portable sitz bath that fits over the toilet.  If told, put ice on the painful area. It may be helpful to use ice between your warm baths. ? Put ice in a plastic bag. ? Place a towel between your skin and the bag. ? Leave the ice on for 20 minutes, 2-3 times a day. General instructions  Take over-the-counter and prescription medicines only as told by your doctor. ? Medicated creams and medicines may be used as told.  Exercise often. Ask your doctor how much and what kind of exercise is best for you.  Go to the bathroom when you have the urge to poop. Do not wait.  Avoid pushing too hard when you poop.  Keep your butt dry and clean. Use wet toilet paper or moist towelettes after pooping.  Do not sit on the toilet for a long time.  Keep all follow-up visits as told by your doctor. This is important. Contact a doctor if you:  Have pain and swelling that do not get better with treatment or medicine.  Have trouble pooping.  Cannot poop.  Have pain or swelling outside the area of the hemorrhoids. Get help right away if you have:  Bleeding that will not stop. Summary  Hemorrhoids are swollen veins in the butt or around the opening of the butt.  They can cause pain, itching, or bleeding.  Eat foods that have a lot of fiber in them. These include whole grains, beans, nuts, fruits, and vegetables.  Take a warm-water bath (sitz bath) for 20 minutes to ease pain. Do this 3-4 times a day. This information is not intended to replace advice given to you by your health care provider. Make sure you discuss any questions you have with your health care provider. Document Released: 03/14/2008 Document Revised: 10/25/2017 Document Reviewed: 10/25/2017 Elsevier Interactive Patient Education  2019 [place]. Surgical Procedures for Hemorrhoids Surgical procedures can be used to treat hemorrhoids. Hemorrhoids are swollen veins that are inside the rectum  (internal hemorrhoids) or around the anus (external hemorrhoids). They are caused by increased pressure in the anal area. This pressure may result from straining to have a bowel movement (constipation), diarrhea, pregnancy, obesity, anal sex, or sitting for long periods of time. Hemorrhoids can cause symptoms such as pain and bleeding. Surgery may be needed if diet changes, lifestyle changes, and other treatments do not help your symptoms. Various surgical methods may be used. Three common methods are:  Closed hemorrhoidectomy. The hemorrhoids are surgically removed, and the surgical cuts (incisions) are closed with stitches (sutures).  Open hemorrhoidectomy. The hemorrhoids are surgically removed, but the incisions are allowed to heal without sutures.  Stapled hemorrhoidopexy. The hemorrhoids are removed using a device  that takes out a ring of excess tissue. Tell a health care provider about:  Any allergies you have.  All medicines you are taking, including vitamins, herbs, eye drops, creams, and over-the-counter medicines.  Any problems you or family members have had with anesthetic medicines.  Any blood disorders you have.  Any surgeries you have had.  Any medical conditions you have.  Whether you are pregnant or may be pregnant. What are the risks? Generally, this is a safe procedure. However, problems may occur, including:  Infection.  Bleeding.  Allergic reactions to medicines.  Damage to other structures or organs.  Pain.  Constipation.  Difficulty passing urine.  Narrowing of the anal canal (stenosis).  Difficulty controlling bowel movements (incontinence). What happens before the procedure?  Ask your health care provider about: ? Changing or stopping your regular medicines. This is especially important if you are taking diabetes medicines or blood thinners. ? Taking medicines such as aspirin and ibuprofen. These medicines can thin your blood. Do not take these  medicines before your procedure if your health care provider instructs you not to.  You may need to have a procedure to examine the inside of your colon with a scope (colonoscopy). Your health care provider may do this to make sure that there are no other causes for your bleeding or pain.  Follow instructions from your health care provider about eating or drinking restrictions.  You may be instructed to take a laxative and an enema to clean out your colon before surgery (bowel prep). Carefully follow instructions from your health care provider about bowel prep.  Ask your health care provider how your surgical site will be marked or identified.  You may be given antibiotic medicine to help prevent infection.  Plan to have someone take you home after the procedure. What happens during the procedure?  To reduce your risk of infection: ? Your health care team will wash or sanitize their hands. ? Your skin will be washed with soap.  An IV tube will be inserted into one of your veins.  You will be given one or more of the following: ? A medicine to help you relax (sedative). ? A medicine to numb the area (local anesthetic). ? A medicine to make you fall asleep (general anesthetic). ? A medicine that is injected into an area of your body to numb everything below the injection site (regional anesthetic).  A lubricating jelly may be placed into your rectum.  Your surgeon will insert a short scope (anoscope) into your rectum to examine the hemorrhoids.  One of the following hemorrhoid procedures will be performed. Closed Hemorrhoidectomy  Your surgeon will use surgical instruments to open the tissue around the hemorrhoids.  The veins that supply the hemorrhoids will be tied off with a suture.  The hemorrhoids will be removed.  The tissue that surrounds the hemorrhoids will be closed with sutures that your body can absorb (absorbable sutures). Open Hemorrhoidectomy  The hemorrhoids  will be removed with surgical instruments.  The incisions will be left open to heal without sutures. Stapled Hemorrhoidopexy  Your surgeon will use a circular stapling device to remove the hemorrhoids.  The device will be inserted into your anus. It will remove a circular ring of tissue that includes hemorrhoid tissue and some tissue above the hemorrhoids.  The staples in the device will close the edges of removed tissue. This will cut off the blood supply to the hemorrhoids and will pull any remaining hemorrhoids back into  place. Each of these procedures may vary among health care providers and hospitals. What happens after the procedure?  Your blood pressure, heart rate, breathing rate, and blood oxygen level will be monitored often until the medicines you were given have worn off.  You will be given pain medicine as needed. This information is not intended to replace advice given to you by your health care provider. Make sure you discuss any questions you have with your health care provider. Document Released: 04/02/2009 Document Revised: 11/11/2015 Document Reviewed: 08/31/2014 Elsevier Interactive Patient Education  2019 [place] Anesthesia, Adult General anesthesia is the use of medicines to make a person "go to sleep" (unconscious) for a medical procedure. General anesthesia must be used for certain procedures, and is often recommended for procedures that:  Last a long time.  Require you to be still or in an unusual position.  Are major and can cause blood loss. The medicines used for general anesthesia are called general anesthetics. As well as making you unconscious for a certain amount of time, these medicines:  Prevent pain.  Control your blood pressure.  Relax your muscles. Tell a health care provider about:  Any allergies you have.  All medicines you are taking, including vitamins, herbs, eye drops, creams, and over-the-counter medicines.  Any  problems you or family members have had with anesthetic medicines.  Types of anesthetics you have had in the past.  Any blood disorders you have.  Any surgeries you have had.  Any medical conditions you have.  Any recent upper respiratory, chest, or ear infections.  Any history of: ? Heart or lung conditions, such as heart failure, sleep apnea, asthma, or chronic obstructive pulmonary disease (COPD). ? Armed forces logistics/support/administrative officer. ? Depression or anxiety.  Any tobacco or drug use, including marijuana or alcohol use.  Whether you are pregnant or may be pregnant. What are the risks? Generally, this is a safe procedure. However, problems may occur, including:  Allergic reaction.  Lung and heart problems.  Inhaling food or liquid from the stomach into the lungs (aspiration).  Nerve injury.  Dental injury.  Air in the bloodstream, which can lead to stroke.  Extreme agitation or confusion (delirium) when you wake up from the anesthetic.  Waking up during your procedure and being unable to move. This is rare. These problems are more likely to develop if you are having a major surgery or if you have an advanced or serious medical condition. You can prevent some of these complications by answering all of your health care provider's questions thoroughly and by following all instructions before your procedure. General anesthesia can cause side effects, including:  Nausea or vomiting.  A sore throat from the breathing tube.  Hoarseness.  Wheezing or coughing.  Shaking chills.  Tiredness.  Body aches.  Anxiety.  Sleepiness or drowsiness.  Confusion or agitation. What happens before the procedure? Staying hydrated Follow instructions from your health care provider about hydration, which may include:  Up to 2 hours before the procedure - you may continue to drink clear liquids, such as water, clear fruit juice, black coffee, and plain tea.  Eating and drinking  restrictions Follow instructions from your health care provider about eating and drinking, which may include:  8 hours before the procedure - stop eating heavy meals or foods such as meat, fried foods, or fatty foods.  6 hours before the procedure - stop eating light meals or foods, such as toast or cereal.  6 hours before  the procedure - stop drinking milk or drinks that contain milk.  2 hours before the procedure - stop drinking clear liquids. Medicines Ask your health care provider about:  Changing or stopping your regular medicines. This is especially important if you are taking diabetes medicines or blood thinners.  Taking medicines such as aspirin and ibuprofen. These medicines can thin your blood. Do not take these medicines unless your health care provider tells you to take them.  Taking over-the-counter medicines, vitamins, herbs, and supplements. Do not take these during the week before your procedure unless your health care provider approves them. General instructions  Starting 3-6 weeks before the procedure, do not use any products that contain nicotine or tobacco, such as cigarettes and e-cigarettes. If you need help quitting, ask your health care provider.  If you brush your teeth on the morning of the procedure, make sure to spit out all of the toothpaste.  Tell your health care provider if you become ill or develop a cold, cough, or fever.  If instructed by your health care provider, bring your sleep apnea device with you on the day of your surgery (if applicable).  Ask your health care provider if you will be going home the same day, the following day, or after a longer hospital stay. ? Plan to have someone take you home from the hospital or clinic. ? Plan to have a responsible adult care for you for at least 24 hours after you leave the hospital or clinic. This is important. What happens during the procedure?   You will be given anesthetics through both of the  following: ? A mask placed over your nose and mouth. ? An IV in one of your veins.  You may receive a medicine to help you relax (sedative).  After you are unconscious, a breathing tube may be inserted down your throat to help you breathe. This will be removed before you wake up.  An anesthesia specialist will stay with you throughout your procedure. He or she will: ? Keep you comfortable and safe by continuing to give you medicines and adjusting the amount of medicine that you get. ? Monitor your blood pressure, pulse, and oxygen levels to make sure that the anesthetics do not cause any problems. The procedure may vary among health care providers and hospitals. What happens after the procedure?  Your blood pressure, temperature, heart rate, breathing rate, and blood oxygen level will be monitored until the medicines you were given have worn off.  You will wake up in a recovery area. You may wake up slowly.  If you feel anxious or agitated, you may be given medicine to help you calm down.  If you will be going home the same day, your health care provider may check to make sure you can walk, drink, and urinate.  Your health care provider will treat any pain or side effects you have before you go home.  Do not drive for 24 hours if you were given a sedative. Summary  General anesthesia is used to keep you still and prevent pain during a procedure.  It is important to tell your health care provider about your medical history and any surgeries you have had, and previous experience with anesthesia.  Follow your health care provider's instructions about when to stop eating, drinking, or taking certain medicines before your procedure.  Plan to have someone take you home from the hospital or clinic. This information is not intended to replace advice given to you  by your health care provider. Make sure you discuss any questions you have with your health care provider. Document Released:  09/12/2007 Document Revised: 10/23/2017 Document Reviewed: 01/19/2017 Elsevier Interactive Patient Education  2019 [place] Anesthesia, Adult, Care After This sheet gives you information about how to care for yourself after your procedure. Your health care provider may also give you more specific instructions. If you have problems or questions, contact your health care provider. What can I expect after the procedure? After the procedure, the following side effects are common:  Pain or discomfort at the IV site.  Nausea.  Vomiting.  Sore throat.  Trouble concentrating.  Feeling cold or chills.  Weak or tired.  Sleepiness and fatigue.  Soreness and body aches. These side effects can affect parts of the body that were not involved in surgery. Follow these instructions at home:  For at least 24 hours after the procedure:  Have a responsible adult stay with you. It is important to have someone help care for you until you are awake and alert.  Rest as needed.  Do not: ? Participate in activities in which you could fall or become injured. ? Drive. ? Use heavy machinery. ? Drink alcohol. ? Take sleeping pills or medicines that cause drowsiness. ? Make important decisions or sign legal documents. ? Take care of children on your own. Eating and drinking  Follow any instructions from your health care provider about eating or drinking restrictions.  When you feel hungry, start by eating small amounts of foods that are soft and easy to digest (bland), such as toast. Gradually return to your regular diet.  Drink enough fluid to keep your urine pale yellow.  If you vomit, rehydrate by drinking water, juice, or clear broth. General instructions  If you have sleep apnea, surgery and certain medicines can increase your risk for breathing problems. Follow instructions from your health care provider about wearing your sleep device: ? Anytime you are sleeping, including  during daytime naps. ? While taking prescription pain medicines, sleeping medicines, or medicines that make you drowsy.  Return to your normal activities as told by your health care provider. Ask your health care provider what activities are safe for you.  Take over-the-counter and prescription medicines only as told by your health care provider.  If you smoke, do not smoke without supervision.  Keep all follow-up visits as told by your health care provider. This is important. Contact a health care provider if:  You have nausea or vomiting that does not get better with medicine.  You cannot eat or drink without vomiting.  You have pain that does not get better with medicine.  You are unable to pass urine.  You develop a skin rash.  You have a fever.  You have redness around your IV site that gets worse. Get help right away if:  You have difficulty breathing.  You have chest pain.  You have blood in your urine or stool, or you vomit blood. Summary  After the procedure, it is common to have a sore throat or nausea. It is also common to feel tired.  Have a responsible adult stay with you for the first 24 hours after general anesthesia. It is important to have someone help care for you until you are awake and alert.  When you feel hungry, start by eating small amounts of foods that are soft and easy to digest (bland), such as toast. Gradually return to your regular diet.  Drink  enough fluid to keep your urine pale yellow.  Return to your normal activities as told by your health care provider. Ask your health care provider what activities are safe for you. This information is not intended to replace advice given to you by your health care provider. Make sure you discuss any questions you have with your health care provider. Document Released: 09/11/2000 Document Revised: 01/19/2017 Document Reviewed: 01/19/2017 Elsevier Interactive Patient Education  2019 [name].

## 2018-07-03 ENCOUNTER — Inpatient Hospital Stay (HOSPITAL_COMMUNITY)
Admission: RE | Admit: 2018-07-03 | Discharge: 2018-07-03 | Disposition: A | Discharge status: Home / Self Care | Payer: 59 | Source: Ambulatory Visit

## 2018-07-03 DIAGNOSIS — L570 Actinic keratosis: Secondary | ICD-10-CM | POA: Diagnosis not present

## 2018-07-03 DIAGNOSIS — L821 Other seborrheic keratosis: Secondary | ICD-10-CM | POA: Diagnosis not present

## 2018-07-03 DIAGNOSIS — D239 Other benign neoplasm of skin, unspecified: Secondary | ICD-10-CM | POA: Diagnosis not present

## 2018-07-04 ENCOUNTER — Encounter (HOSPITAL_COMMUNITY)
Admission: RE | Admit: 2018-07-04 | Discharge: 2018-07-04 | Disposition: A | Discharge status: Home / Self Care | Payer: 59 | Source: Ambulatory Visit | Attending: General Surgery | Admitting: General Surgery

## 2018-07-04 ENCOUNTER — Encounter (HOSPITAL_COMMUNITY): Payer: Self-pay

## 2018-07-04 ENCOUNTER — Other Ambulatory Visit: Payer: Self-pay

## 2018-07-04 DIAGNOSIS — Z01818 Encounter for other preprocedural examination: Secondary | ICD-10-CM | POA: Diagnosis not present

## 2018-07-04 HISTORY — DX: Unspecified osteoarthritis, unspecified site: M19.90

## 2018-07-04 LAB — CBC WITH DIFFERENTIAL/PLATELET
Abs Immature Granulocytes: 0 10*3/uL (ref 0.00–0.07)
Basophils Absolute: 0 10*3/uL (ref 0.0–0.1)
Basophils Relative: 1 %
Eosinophils Absolute: 0.2 10*3/uL (ref 0.0–0.5)
Eosinophils Relative: 4 %
HCT: 41.4 % (ref 36.0–46.0)
Hemoglobin: 13.7 g/dL (ref 12.0–15.0)
Immature Granulocytes: 0 %
Lymphocytes Relative: 50 %
Lymphs Abs: 2 10*3/uL (ref 0.7–4.0)
MCH: 30.5 pg (ref 26.0–34.0)
MCHC: 33.1 g/dL (ref 30.0–36.0)
MCV: 92.2 fL (ref 80.0–100.0)
MONO ABS: 0.4 10*3/uL (ref 0.1–1.0)
Monocytes Relative: 11 %
Neutro Abs: 1.3 10*3/uL — ABNORMAL LOW (ref 1.7–7.7)
Neutrophils Relative %: 34 %
Platelets: 187 10*3/uL (ref 150–400)
RBC: 4.49 MIL/uL (ref 3.87–5.11)
RDW: 12.2 % (ref 11.5–15.5)
WBC: 3.9 10*3/uL — ABNORMAL LOW (ref 4.0–10.5)
nRBC: 0 % (ref 0.0–0.2)

## 2018-07-10 ENCOUNTER — Ambulatory Visit (HOSPITAL_COMMUNITY)
Admission: RE | Admit: 2018-07-10 | Discharge: 2018-07-10 | Disposition: A | Discharge status: Home / Self Care | Payer: 59 | Attending: General Surgery | Admitting: General Surgery

## 2018-07-10 ENCOUNTER — Ambulatory Visit (HOSPITAL_COMMUNITY): Payer: 59 | Admitting: Anesthesiology

## 2018-07-10 ENCOUNTER — Encounter (HOSPITAL_COMMUNITY)
Admission: RE | Disposition: A | Discharge status: Home / Self Care | Payer: Self-pay | Source: Home / Self Care | Attending: General Surgery

## 2018-07-10 ENCOUNTER — Encounter (HOSPITAL_COMMUNITY): Payer: Self-pay | Admitting: *Deleted

## 2018-07-10 DIAGNOSIS — K642 Third degree hemorrhoids: Secondary | ICD-10-CM | POA: Diagnosis present

## 2018-07-10 DIAGNOSIS — K648 Other hemorrhoids: Secondary | ICD-10-CM | POA: Diagnosis not present

## 2018-07-10 DIAGNOSIS — Z8249 Family history of ischemic heart disease and other diseases of the circulatory system: Secondary | ICD-10-CM | POA: Insufficient documentation

## 2018-07-10 DIAGNOSIS — K649 Unspecified hemorrhoids: Secondary | ICD-10-CM | POA: Diagnosis not present

## 2018-07-10 DIAGNOSIS — K644 Residual hemorrhoidal skin tags: Secondary | ICD-10-CM | POA: Diagnosis not present

## 2018-07-10 DIAGNOSIS — M1990 Unspecified osteoarthritis, unspecified site: Secondary | ICD-10-CM | POA: Diagnosis not present

## 2018-07-10 DIAGNOSIS — Z79899 Other long term (current) drug therapy: Secondary | ICD-10-CM | POA: Diagnosis not present

## 2018-07-10 HISTORY — PX: HEMORRHOID SURGERY: SHX153

## 2018-07-10 SURGERY — HEMORRHOIDECTOMY
Anesthesia: General | Site: Rectum

## 2018-07-10 MED ORDER — BUPIVACAINE LIPOSOME 1.3 % IJ SUSP
INTRAMUSCULAR | Status: AC
  Filled 2018-07-10: qty 20

## 2018-07-10 MED ORDER — CHLORHEXIDINE GLUCONATE CLOTH 2 % EX PADS: 6.0000 | MEDICATED_PAD | Freq: Once | CUTANEOUS | Status: DC

## 2018-07-10 MED ORDER — HYDROCODONE-ACETAMINOPHEN 7.5-325 MG PO TABS: 1.0000 | ORAL_TABLET | Freq: Once | ORAL | Status: DC | PRN

## 2018-07-10 MED ORDER — MIDAZOLAM HCL 2 MG/2ML IJ SOLN: 0.5000 mg | Freq: Once | INTRAMUSCULAR | Status: DC | PRN

## 2018-07-10 MED ORDER — MIDAZOLAM HCL 5 MG/5ML IJ SOLN
INTRAMUSCULAR | Status: DC | PRN
  Administered 2018-07-10: 2 mg via INTRAVENOUS

## 2018-07-10 MED ORDER — PROPOFOL 10 MG/ML IV BOLUS
INTRAVENOUS | Status: AC
  Filled 2018-07-10: qty 20

## 2018-07-10 MED ORDER — FENTANYL CITRATE (PF) 100 MCG/2ML IJ SOLN
INTRAMUSCULAR | Status: AC
  Filled 2018-07-10: qty 2

## 2018-07-10 MED ORDER — OXYCODONE HCL 5 MG PO TABS: 5.0000 mg | ORAL_TABLET | ORAL | 0 refills | Status: DC | PRN

## 2018-07-10 MED ORDER — BUPIVACAINE LIPOSOME 1.3 % IJ SUSP
INTRAMUSCULAR | Status: DC | PRN
  Administered 2018-07-10: 20 mL

## 2018-07-10 MED ORDER — MIDAZOLAM HCL 2 MG/2ML IJ SOLN
INTRAMUSCULAR | Status: AC
  Filled 2018-07-10: qty 2

## 2018-07-10 MED ORDER — DOCUSATE SODIUM 100 MG PO CAPS: 100.0000 mg | ORAL_CAPSULE | Freq: Two times a day (BID) | ORAL | 2 refills | Status: DC

## 2018-07-10 MED ORDER — FENTANYL CITRATE (PF) 100 MCG/2ML IJ SOLN
INTRAMUSCULAR | Status: DC | PRN
  Administered 2018-07-10 (×3): 25 ug via INTRAVENOUS

## 2018-07-10 MED ORDER — 0.9 % SODIUM CHLORIDE (POUR BTL) OPTIME
TOPICAL | Status: DC | PRN
  Administered 2018-07-10: 1000 mL

## 2018-07-10 MED ORDER — PROPOFOL 10 MG/ML IV BOLUS
INTRAVENOUS | Status: DC | PRN
  Administered 2018-07-10: 30 mg via INTRAVENOUS
  Administered 2018-07-10: 120 mg via INTRAVENOUS
  Administered 2018-07-10: 20 mg via INTRAVENOUS

## 2018-07-10 MED ORDER — LIDOCAINE VISCOUS HCL 2 % MT SOLN
OROMUCOSAL | Status: DC | PRN
  Administered 2018-07-10: 1

## 2018-07-10 MED ORDER — PROMETHAZINE HCL 25 MG/ML IJ SOLN: 6.2500 mg | INTRAMUSCULAR | Status: DC | PRN

## 2018-07-10 MED ORDER — LIDOCAINE VISCOUS HCL 2 % MT SOLN
OROMUCOSAL | Status: AC
  Filled 2018-07-10: qty 15

## 2018-07-10 MED ORDER — LACTATED RINGERS IV SOLN
INTRAVENOUS | Status: DC
  Administered 2018-07-10: 08:00:00 via INTRAVENOUS

## 2018-07-10 MED ORDER — HYDROMORPHONE HCL 1 MG/ML IJ SOLN: 0.2500 mg | INTRAMUSCULAR | Status: DC | PRN

## 2018-07-10 SURGICAL SUPPLY — 31 items
CLOTH BEACON ORANGE TIMEOUT ST (SAFETY) ×2 IMPLANT
COVER LIGHT HANDLE STERIS (MISCELLANEOUS) ×4 IMPLANT
COVER WAND RF STERILE (DRAPES) ×2 IMPLANT
DRAPE HALF SHEET 40X57 (DRAPES) ×2 IMPLANT
DRAPE PROXIMA HALF (DRAPES) ×2 IMPLANT
ELECT REM PT RETURN 9FT ADLT (ELECTROSURGICAL) ×2
ELECTRODE REM PT RTRN 9FT ADLT (ELECTROSURGICAL) ×1 IMPLANT
GAUZE SPONGE 4X4 12PLY STRL (GAUZE/BANDAGES/DRESSINGS) ×2 IMPLANT
GLOVE BIO SURGEON STRL SZ 6.5 (GLOVE) ×4 IMPLANT
GLOVE BIO SURGEON STRL SZ7 (GLOVE) ×2 IMPLANT
GLOVE BIOGEL PI IND STRL 6.5 (GLOVE) ×1 IMPLANT
GLOVE BIOGEL PI IND STRL 7.0 (GLOVE) ×2 IMPLANT
GLOVE BIOGEL PI INDICATOR 6.5 (GLOVE) ×1
GLOVE BIOGEL PI INDICATOR 7.0 (GLOVE) ×2
GOWN STRL REUS W/TWL LRG LVL3 (GOWN DISPOSABLE) ×4 IMPLANT
HEMOSTAT SURGICEL 4X8 (HEMOSTASIS) ×2 IMPLANT
KIT TURNOVER CYSTO (KITS) ×2 IMPLANT
LIGASURE IMPACT 36 18CM CVD LR (INSTRUMENTS) ×2 IMPLANT
MANIFOLD NEPTUNE II (INSTRUMENTS) ×2 IMPLANT
NEEDLE HYPO 18GX1.5 BLUNT FILL (NEEDLE) ×2 IMPLANT
NEEDLE HYPO 21X1.5 SAFETY (NEEDLE) ×2 IMPLANT
NS IRRIG 1000ML POUR BTL (IV SOLUTION) ×2 IMPLANT
PACK PERI GYN (CUSTOM PROCEDURE TRAY) ×2 IMPLANT
PAD ARMBOARD 7.5X6 YLW CONV (MISCELLANEOUS) ×2 IMPLANT
SET BASIN LINEN APH (SET/KITS/TRAYS/PACK) ×2 IMPLANT
SPONGE SURGIFOAM ABS GEL 100 (HEMOSTASIS) ×2 IMPLANT
SURGILUBE 3G PEEL PACK STRL (MISCELLANEOUS) ×2 IMPLANT
SUT SILK 0 FSL (SUTURE) ×2 IMPLANT
SUT VIC AB 2-0 CT2 27 (SUTURE) IMPLANT
SYR 20CC LL (SYRINGE) ×2 IMPLANT
SYR BULB IRRIGATION 50ML (SYRINGE) ×2 IMPLANT

## 2018-07-10 NOTE — Anesthesia Postprocedure Evaluation (Signed)
Anesthesia Post Note  Patient: Annette Vance  Procedure(s) Performed: EXTENSIVE HEMORRHOIDECTOMY (N/A Rectum)  Patient location during evaluation: PACU Anesthesia Type: General Level of consciousness: awake and alert and oriented Pain management: pain level controlled Vital Signs Assessment: post-procedure vital signs reviewed and stable Respiratory status: spontaneous breathing Cardiovascular status: blood pressure returned to baseline and stable Postop Assessment: patient able to bend at knees and no apparent nausea or vomiting Anesthetic complications: no     Last Vitals:  Vitals:   07/10/18 0945 07/10/18 1006  BP:  (!) 98/57  Pulse: 60 64  Resp: 13 16  Temp:  36.7 C  SpO2:  95%    Last Pain:  Vitals:   07/10/18 1006  TempSrc: Oral  PainSc: 2                  Lakeitha Basques

## 2018-07-10 NOTE — Op Note (Signed)
[facility] Operative Note  07/10/18  Preoperative Diagnosis:  Grade III Hemorrhoids, three columns; external hemorrhoidal skin tags    Postoperative Diagnosis: Same   Procedure(s) Performed: Extensive hemorrhoidectomy; three columns internal and external    Surgeon: Lanell Matar. Constance Haw, MD   Assistants: No qualified resident was available    Anesthesia: General endotracheal   Anesthesiologist: Lenice Llamas, MD    Specimens: Right anterior, right posterior, left lateral hemorrhoid    Estimated Blood Loss: Minimal   Blood Replacement: None    Complications: None   Wound Class: Dirty/ Infected    Operative Indications: Annette Vance is a 63 yo with a history of hemorrhoids for years. They have been causing her pain and discomfort with activity. She had some laser procedure for hemorrhoids years ago that helped some. We discussed the risk and benefits of hemorrhoid surgery including but not limited to bleeding, infection, risk of injury to the muscle, stenosis of the anus, and possible need to only remove part of the hemorrhoid. She agreed and opted to proceed.  Findings: Large three column hemorrhoids    Procedure: The patient was taken to the operating room and placed supine. General endotracheal anesthesia was induced. Intravenous antibiotics were not administered per protocol.  She was then transferred to lithotomy position and all pressure points were padded.    A digital rectal exam was done and no masses were appreciated but there was fullness from her hemorrhoids. The anoscope was introduced and the columns were examined. She had large hemorrhoids that prolapsed out on exam. They were all fairly equal in nature. I started with the right anterior hemorrhoidal column and incised the anoderm.  A DeBakey was used to elevate the hemorrhoid off the muscle, and a Ligasure was placed on the hemorrhoid column ensuring the muscle was deep to the ligation.  A similar  approach was made on the left lateral and right posterior columns after I ensured adequate anoderm Annette Vance between the columns.  There was still some hemorrhoidal tissue remaining between the right anterior and right posterior columns, but I did not want to remove any more due to decreasing the skin of the anoderm skin bridge.  The area was checked and was hemostatic. A foam roll with surgicel was placed in the anus for hemostasis and this will be removed in PACU.  Xparel was injected.   All counts were correct at the end of the case. The patient was awakened from anesthesia and extubate without complication.  The patient went to the PACU in stable condition.   Annette Labrum, MD [facility] [address] [place], [place] [address] [phone] (office)

## 2018-07-10 NOTE — Interval H&P Note (Signed)
History and Physical Interval Note:  07/10/2018 8:21 AM  Annette Vance  has presented today for surgery, with the diagnosis of external and internal hemorrhoids  The various methods of treatment have been discussed with the patient and family. After consideration of risks, benefits and other options for treatment, the patient has consented to  Procedure(s): EXTENSIVE HEMORRHOIDECTOMY (N/A) as a surgical intervention .  The patient's history has been reviewed, patient examined, no change in status, stable for surgery.  I have reviewed the patient's chart and labs.  Questions were answered to the patient's satisfaction.    No major questions. No changes.   Virl Cagey

## 2018-07-10 NOTE — Anesthesia Procedure Notes (Signed)
Procedure Name: LMA Insertion Date/Time: 07/10/2018 8:42 AM Performed by: Ollen Bowl, CRNA Pre-anesthesia Checklist: Patient identified, Patient being monitored, Emergency Drugs available, Timeout performed and Suction available Patient Re-evaluated:Patient Re-evaluated prior to induction Oxygen Delivery Method: Circle System Utilized Preoxygenation: Pre-oxygenation with 100% oxygen Induction Type: IV induction Ventilation: Mask ventilation without difficulty LMA: LMA inserted LMA Size: 3.0 Number of attempts: 1 Placement Confirmation: positive ETCO2 and breath sounds checked- equal and bilateral

## 2018-07-10 NOTE — Transfer of Care (Signed)
Immediate Anesthesia Transfer of Care Note  Patient: Annette Vance  Procedure(s) Performed: EXTENSIVE HEMORRHOIDECTOMY (N/A Rectum)  Patient Location: PACU  Anesthesia Type:General  Level of Consciousness: awake  Airway & Oxygen Therapy: Patient Spontanous Breathing  Post-op Assessment: Report given to RN  Post vital signs: Reviewed and stable  Last Vitals:  Vitals Value Taken Time  BP 102/72 07/10/2018  9:34 AM  Temp    Pulse 72 07/10/2018  9:37 AM  Resp 19 07/10/2018  9:37 AM  SpO2 94 % 07/10/2018  9:37 AM  Vitals shown include unvalidated device data.  Last Pain:  Vitals:   07/10/18 0722  TempSrc: Oral  PainSc: 0-No pain      Patients Stated Pain Goal: 7 (61/51/83 4373)  Complications: No apparent anesthesia complications

## 2018-07-10 NOTE — Anesthesia Preprocedure Evaluation (Addendum)
Anesthesia Evaluation  Patient identified by MRN, date of birth, ID band Patient awake    Reviewed: Allergy & Precautions, NPO status , Patient's Chart, lab work & pertinent test results  Airway Mallampati: II  TM Distance: >3 FB Neck ROM: Full    Dental no notable dental hx. (+) Teeth Intact   Pulmonary neg pulmonary ROS,    Pulmonary exam normal breath sounds clear to auscultation       Cardiovascular Exercise Tolerance: Good negative cardio ROS Normal cardiovascular examI Rhythm:Regular Rate:Normal     Neuro/Psych negative neurological ROS  negative psych ROS   GI/Hepatic Neg liver ROS, GERD  ,occ dysphagia -denies current GERD Sx or meds    Endo/Other  negative endocrine ROS  Renal/GU negative Renal ROS  negative genitourinary   Musculoskeletal  (+) Arthritis , Osteoarthritis,    Abdominal   Peds negative pediatric ROS (+)  Hematology negative hematology ROS (+)   Anesthesia Other Findings   Reproductive/Obstetrics negative OB ROS                             Anesthesia Physical Anesthesia Plan  ASA: II  Anesthesia Plan: General   Post-op Pain Management:    Induction: Intravenous  PONV Risk Score and Plan:   Airway Management Planned: LMA  Additional Equipment:   Intra-op Plan:   Post-operative Plan:   Informed Consent: I have reviewed the patients History and Physical, chart, labs and discussed the procedure including the risks, benefits and alternatives for the proposed anesthesia with the patient or authorized representative who has indicated his/her understanding and acceptance.     Dental advisory given  Plan Discussed with: CRNA  Anesthesia Plan Comments: (GA vs GETA as needed )        Anesthesia Quick Evaluation

## 2018-07-10 NOTE — Discharge Instructions (Signed)
Please take your roxicodone as prescribed and alternate with tylenol every 4-6 hours.   Do not take any aspirin or NSAIDs, ibuprofen for 5 days.  Please keep the area clean and dry and take Sitz baths (swallow warm water baths) for comfort and after bowel movements.  Please keep your stools soft and take fiber daily (metamucil) and colace (over the counter) to help prevent constipation.  If you have not had a BM in 2 days, please notify Dr. Constance Haw.  Expect some bleeding following the hemorrhoid surgery and significant pain.  Go to the ED with extensive bleeding (soaking through more than 2-3 pads in an hour), fevers, or chills.     Surgical Procedures for Hemorrhoids, Care After Refer to this sheet in the next few weeks. These instructions provide you with information about caring for yourself after your procedure. Your health care provider may also give you more specific instructions. Your treatment has been planned according to current medical practices, but problems sometimes occur. Call your health care provider if you have any problems or questions after your procedure. What can I expect after the procedure? After the procedure, it is common to have:  Rectal pain.  Pain when you are having a bowel movement.  Slight rectal bleeding. Follow these instructions at home: Medicines  Take over-the-counter and prescription medicines only as told by your health care provider.  Do not drive or operate heavy machinery while taking prescription pain medicine.  Use a stool softener or a bulk laxative as told by your health care provider. Activity  Rest at home. Return to your normal activities as told by your health care provider.  Do not lift anything that is heavier than 10 lb (4.5 kg).  Do not sit for long periods of time. Take a walk every day or as told by your health care provider.  Do not strain to have a bowel movement. Do not spend a long time sitting on the toilet. Eating and  drinking  Eat foods that contain fiber, such as whole grains, beans, nuts, fruits, and vegetables.  Drink enough fluid to keep your urine clear or pale yellow. General instructions  Sit in a warm bath 2-3 times per day to relieve soreness or itching.  Keep all follow-up visits as told by your health care provider. This is important. Contact a health care provider if:  Your pain medicine is not helping.  You have a fever or chills.  You become constipated.  You have trouble passing urine. Get help right away if:  You have very bad rectal pain.  You have heavy bleeding from your rectum. This information is not intended to replace advice given to you by your health care provider. Make sure you discuss any questions you have with your health care provider. Document Released: 08/26/2003 Document Revised: 11/11/2015 Document Reviewed: 08/31/2014 Elsevier Interactive Patient Education  2019 [name].  How to Take a [employer] A sitz bath is a warm water bath that may be used to care for your rectum, genital area, or the area between your rectum and genitals (perineum). For a sitz bath, the water only comes up to your hips and covers your buttocks. A sitz bath may done at home in a bathtub or with a portable sitz bath that fits over the toilet. Your health care provider may recommend a sitz bath to help:  Relieve pain and discomfort after delivering a baby.  Relieve pain and itching from hemorrhoids or anal fissures.  Relieve  pain after certain surgeries.  Relax muscles that are sore or tight. How to take a sitz bath Take 3-4 sitz baths a day, or as many as told by your health care provider. Bathtub sitz bath To take a sitz bath in a bathtub: 1. Partially fill a bathtub with warm water. The water should be deep enough to cover your hips and buttocks when you are sitting in the tub. 2. If your health care provider told you to put medicine in the water, follow his or her  instructions. 3. Sit in the water. 4. Open the tub drain a little, and leave it open during your bath. 5. Turn on the warm water again, enough to replace the water that is draining out. Keep the water running throughout your bath. This helps keep the water at the right level and the right temperature. 6. Soak in the water for 15-20 minutes, or as long as told by your health care provider. 7. When you are done, be careful when you stand up. You may feel dizzy. 8. After the sitz bath, pat yourself dry. Do not rub your skin to dry it.  Over-the-toilet sitz bath To take a sitz bath with an over-the-toilet basin: 1. Follow the manufacturer's instructions. 2. Fill the basin with warm water. 3. If your health care provider told you to put medicine in the water, follow his or her instructions. 4. Sit on the seat. Make sure the water covers your buttocks and perineum. 5. Soak in the water for 15-20 minutes, or as long as told by your health care provider. 6. After the sitz bath, pat yourself dry. Do not rub your skin to dry it. 7. Clean and dry the basin between uses. 8. Discard the basin if it cracks, or according to the manufacturer's instructions. Contact a health care provider if:  Your symptoms get worse. Do not continue with sitz baths if your symptoms get worse.  You have new symptoms. If this happens, do not continue with sitz baths until you talk with your health care provider. Summary  A sitz bath is a warm water bath in which the water only comes up to your hips and covers your buttocks.  A sitz bath may help relieve itching, relieve pain, and relax muscles that are sore or tight in the lower part of your body, including your genital area.  Take 3-4 sitz baths a day, or as many as told by your health care provider. Soak in the water for 15-20 minutes.  Do not continue with sitz baths if your symptoms get worse. This information is not intended to replace advice given to you by your  health care provider. Make sure you discuss any questions you have with your health care provider. Document Released: 02/26/2004 Document Revised: 06/07/2017 Document Reviewed: 06/07/2017 Elsevier Interactive Patient Education  2019 [name].

## 2018-07-11 ENCOUNTER — Encounter (HOSPITAL_COMMUNITY): Payer: Self-pay | Admitting: General Surgery

## 2018-07-20 HISTORY — PX: SHOULDER ARTHROSCOPY: SHX128

## 2018-07-25 ENCOUNTER — Ambulatory Visit (INDEPENDENT_AMBULATORY_CARE_PROVIDER_SITE_OTHER): Payer: Self-pay | Admitting: General Surgery

## 2018-07-25 ENCOUNTER — Encounter: Payer: Self-pay | Admitting: General Surgery

## 2018-07-25 VITALS — BP 122/80 | HR 93 | Temp 96.9°F | Resp 18 | Wt 152.4 lb

## 2018-07-25 DIAGNOSIS — K642 Third degree hemorrhoids: Secondary | ICD-10-CM

## 2018-07-25 DIAGNOSIS — K644 Residual hemorrhoidal skin tags: Secondary | ICD-10-CM

## 2018-07-25 NOTE — Progress Notes (Signed)
[facility] Note   HPI:  63 y.o. Female presents to clinic for post-op follow-up evaluation after hemorrhoidectomy. Patient reports she is doing great. She has had no pain or bleeding in several days. She says she is keeping her stools soft, and is feeling good.  Review of Systems:  No fever or chills Pain improved  All other review of systems: otherwise negative   Pathology: Diagnosis 1. Hemorrhoids, right anterior - HEMORRHOIDS. 2. Hemorrhoids, left lateral - HEMORRHOIDS. 3. Hemorrhoids, right posterior - HEMORRHOIDS.  Vital Signs:  BP 122/80 (BP Location: Left Arm, Patient Position: Sitting, Cuff Size: Normal)   Pulse 93   Temp (!) 96.9 F (36.1 C) (Temporal)   Resp 18   Wt 152 lb 6.4 oz (69.1 kg)   BMI 24.60 kg/m    Physical Exam:  Physical Exam HENT:     Head: Normocephalic.  Cardiovascular:     Rate and Rhythm: Normal rate.  Pulmonary:     Effort: Pulmonary effort is normal.  Genitourinary:    Comments: Minor Swelling of the external tags that remain, healing  Neurological:     Mental Status: She is alert.      Assessment:  63 y.o. yo Female s/p hemorrhoidectomy. Doing great. The swelling will continue to improve over next 2-4 weeks.   Plan:  Increase activity slowly.  Diet as tolerated. Keep stools soft. Follow up PRN   All of the above recommendations were discussed with the patient and all questions were answered to her expressed satisfaction.  Curlene Labrum, MD [facility] [address] [place], [place] [address] [phone] (office)

## 2018-07-25 NOTE — Patient Instructions (Signed)
Increase activity slowly.  Diet as tolerated. Keep stools soft.

## 2018-12-16 ENCOUNTER — Encounter (INDEPENDENT_AMBULATORY_CARE_PROVIDER_SITE_OTHER): Payer: Self-pay | Admitting: *Deleted

## 2018-12-16 ENCOUNTER — Other Ambulatory Visit: Payer: Self-pay

## 2018-12-16 ENCOUNTER — Ambulatory Visit (INDEPENDENT_AMBULATORY_CARE_PROVIDER_SITE_OTHER): Payer: 59 | Admitting: Internal Medicine

## 2018-12-16 ENCOUNTER — Other Ambulatory Visit (INDEPENDENT_AMBULATORY_CARE_PROVIDER_SITE_OTHER): Payer: Self-pay | Admitting: Internal Medicine

## 2018-12-16 ENCOUNTER — Encounter (INDEPENDENT_AMBULATORY_CARE_PROVIDER_SITE_OTHER): Payer: Self-pay | Admitting: Internal Medicine

## 2018-12-16 VITALS — BP 100/69 | HR 79 | Temp 98.9°F | Ht 66.0 in | Wt 159.9 lb

## 2018-12-16 DIAGNOSIS — R1310 Dysphagia, unspecified: Secondary | ICD-10-CM | POA: Diagnosis not present

## 2018-12-16 NOTE — Patient Instructions (Signed)
EGD/ED. The risks of bleeding, perforation and infection were reviewed with patient.  

## 2018-12-16 NOTE — Progress Notes (Signed)
Subjective:    Patient ID: Annette Vance, female    DOB: 12-23-1955, 63 y.o.   MRN: [id number]  HPI Referred for dysphagia. She says foods are not going down. If she drinks anything after her foods, she feels like her esophagus becomes clogged. She says she had food hung about 3 weeks ago (vomited up). Her appetite is ok. No weight loss. BMs move okay.   Underwent an EGD/ED in July of 2015  (Indications:  Patient is 63 year old Caucasian female who presents with several month history of dysphagia to solids. She said episodes of food impaction this year. She has history of Schatzki's ring which was last dilated in July 2007.                                                                                                                      which revealed: Coarse appearance to esophageal mucosa raising possibility of eosinophilic esophagitis. High grade Schatzki's ring but focal changes of reflux esophagitis. Small sliding hiatal hernia. Schatzki's ring dilated to/disrupted with balloon to a diameter of 18 mm. It was further disrupted with focal biopsy. Biopsy taken from esophageal mucosa to rule out eosinophilic esophagitis. Biopsy:  Biopsy results reviewed with patient; biopsy reveals changes of reflux esophagitis and not EoE.    Review of Systems Past Medical History:  Diagnosis Date  . Arthritis   . Bradycardia   . Chest discomfort   . Dysphagia   . GERD (gastroesophageal reflux disease)   . Heart palpitations     Past Surgical History:  Procedure Laterality Date  . BALLOON DILATION N/A 12/25/2013   Procedure: BALLOON DILATION;  Surgeon: Rogene Houston, MD;  Location: [facility];  Service: Endoscopy;  Laterality: N/A;  . BIOPSY  12/25/2013   Procedure: BIOPSY;  Surgeon: Rogene Houston, MD;  Location: [facility];  Service: Endoscopy;;  . COLONOSCOPY N/A 07/29/2014   Procedure: COLONOSCOPY;  Surgeon: Rogene Houston, MD;  Location: [facility];  Service: Endoscopy;   Laterality: N/A;  1025 - moved to 9:25 - Ann to notify pt  . ESOPHAGOGASTRODUODENOSCOPY N/A 12/25/2013   Procedure: ESOPHAGOGASTRODUODENOSCOPY (EGD);  Surgeon: Rogene Houston, MD;  Location: [facility];  Service: Endoscopy;  Laterality: N/A;  1255  . HEMORRHOID SURGERY     Laser Surgery with Dr. Romona Curls  . HEMORRHOID SURGERY N/A 07/10/2018   Procedure: EXTENSIVE HEMORRHOIDECTOMY;  Surgeon: Virl Cagey, MD;  Location: [facility];  Service: General;  Laterality: N/A;  . KNEE SURGERY Left    1996  . WRIST SURGERY Left    dequavains    No Known Allergies  Current Outpatient Medications on File Prior to Visit  Medication Sig Dispense Refill  . lactobacillus acidophilus (BACID) TABS tablet Take 2 tablets by mouth. Couple of times a week    . Multiple Vitamin (MULTIVITAMIN WITH MINERALS) TABS tablet Take 1 tablet by mouth daily.    Marland Kitchen omega-3 acid ethyl esters (LOVAZA) 1 g capsule Take  by mouth 2 (two) times daily.    . Vitamin D-Vitamin K (VITAMIN K2-VITAMIN D3 PO) Take 1 tablet by mouth daily.    Marland Kitchen witch hazel-glycerin (TUCKS) pad Apply 1 application topically as needed for itching or hemorrhoids.     No current facility-administered medications on file prior to visit.         Objective:   Physical Exam Blood pressure 100/69, pulse 79, temperature 98.9 F (37.2 C), height 5\' 6"  (1.676 m), weight 159 lb 14.4 oz (72.5 kg). Alert and oriented. Skin warm and dry. Oral mucosa is moist.   . Sclera anicteric, conjunctivae is pink. Thyroid not enlarged. No cervical lymphadenopathy. Lungs clear. Heart regular rate and rhythm.  Abdomen is soft. Bowel sounds are positive. No hepatomegaly. No abdominal masses felt. No tenderness.  No edema to lower extremities.        Assessment & Plan:  Dysphagia. EGD/ED. Hx of  Schatski's ring. Will rule out.

## 2018-12-31 ENCOUNTER — Other Ambulatory Visit (HOSPITAL_COMMUNITY)
Admission: RE | Admit: 2018-12-31 | Discharge: 2018-12-31 | Disposition: A | Discharge status: Home / Self Care | Payer: 59 | Source: Ambulatory Visit | Attending: Internal Medicine | Admitting: Internal Medicine

## 2018-12-31 ENCOUNTER — Encounter (HOSPITAL_COMMUNITY)
Admission: RE | Admit: 2018-12-31 | Discharge: 2018-12-31 | Disposition: A | Discharge status: Home / Self Care | Payer: 59 | Source: Ambulatory Visit | Attending: Internal Medicine | Admitting: Internal Medicine

## 2018-12-31 ENCOUNTER — Other Ambulatory Visit: Payer: Self-pay

## 2018-12-31 DIAGNOSIS — Z1159 Encounter for screening for other viral diseases: Secondary | ICD-10-CM | POA: Insufficient documentation

## 2018-12-31 NOTE — Patient Instructions (Addendum)
Annette Vance  12/31/2018     @PREFPERIOPPHARMACY @   Your procedure is scheduled on 01/03/2019.  Report to [facility] at 11:00 A.M.  Call this number if you have problems the morning of surgery:  [phone]   Remember:  Do not eat or drink after midnight.   Take these medicines the morning of surgery with A SIP OF WATER:  Claritin    Do not wear jewelry, make-up or nail polish.  Do not wear lotions, powders, or perfumes, or deodorant.  Do not shave 48 hours prior to surgery.  Men may shave face and neck.  Do not bring valuables to the hospital.  [facility] is not responsible for any belongings or valuables.  Contacts, dentures or bridgework may not be worn into surgery.  Leave your suitcase in the car.  After surgery it may be brought to your room.  For patients admitted to the hospital, discharge time will be determined by your treatment team.  Patients discharged the day of surgery will not be allowed to drive home.   Name and phone number of your driver:   family Special instructions:  n/a  Please read over the following fact sheets that you were given. Care and Recovery After Surgery    Upper Endoscopy, Adult Upper endoscopy is a procedure to look inside the upper GI (gastrointestinal) tract. The upper GI tract is made up of:  The part of the body that moves food from your mouth to your stomach (esophagus).  The stomach.  The first part of your small intestine (duodenum). This procedure is also called esophagogastroduodenoscopy (EGD) or gastroscopy. In this procedure, your health care provider passes a thin, flexible tube (endoscope) through your mouth and down your esophagus into your stomach. A small camera is attached to the end of the tube. Images from the camera appear on a monitor in the exam room. During this procedure, your health care provider may also remove a small piece of tissue to be sent to a lab and examined under a microscope (biopsy). Your  health care provider may do an upper endoscopy to diagnose cancers of the upper GI tract. You may also have this procedure to find the cause of other conditions, such as:  Stomach pain.  Heartburn.  Pain or problems when swallowing.  Nausea and vomiting.  Stomach bleeding.  Stomach ulcers. Tell a health care provider about:  Any allergies you have.  All medicines you are taking, including vitamins, herbs, eye drops, creams, and over-the-counter medicines.  Any problems you or family members have had with anesthetic medicines.  Any blood disorders you have.  Any surgeries you have had.  Any medical conditions you have.  Whether you are pregnant or may be pregnant. What are the risks? Generally, this is a safe procedure. However, problems may occur, including:  Infection.  Bleeding.  Allergic reactions to medicines.  A tear or hole (perforation) in the esophagus, stomach, or duodenum. What happens before the procedure? Staying hydrated Follow instructions from your health care provider about hydration, which may include:  Up to 2 hours before the procedure - you may continue to drink clear liquids, such as water, clear fruit juice, black coffee, and plain tea.  Eating and drinking restrictions Follow instructions from your health care provider about eating and drinking, which may include:  8 hours before the procedure - stop eating heavy meals or foods, such as meat, fried foods, or fatty foods.  6 hours before the procedure -  stop eating light meals or foods, such as toast or cereal.  6 hours before the procedure - stop drinking milk or drinks that contain milk.  2 hours before the procedure - stop drinking clear liquids. Medicines Ask your health care provider about:  Changing or stopping your regular medicines. This is especially important if you are taking diabetes medicines or blood thinners.  Taking medicines such as aspirin and ibuprofen. These  medicines can thin your blood. Do not take these medicines unless your health care provider tells you to take them.  Taking over-the-counter medicines, vitamins, herbs, and supplements. General instructions  Plan to have someone take you home from the hospital or clinic.  If you will be going home right after the procedure, plan to have someone with you for 24 hours.  Ask your health care provider what steps will be taken to help prevent infection. What happens during the procedure?   An IV will be inserted into one of your veins.  You may be given one or more of the following: ? A medicine to help you relax (sedative). ? A medicine to numb the throat (local anesthetic).  You will lie on your left side on an exam table.  Your health care provider will pass the endoscope through your mouth and down your esophagus.  Your health care provider will use the scope to check the inside of your esophagus, stomach, and duodenum. Biopsies may be taken.  The endoscope will be removed. The procedure may vary among health care providers and hospitals. What happens after the procedure?  Your blood pressure, heart rate, breathing rate, and blood oxygen level will be monitored until you leave the hospital or clinic.  Do not drive for 24 hours if you were given a sedative during your procedure.  When your throat is no longer numb, you may be given some fluids to drink.  It is up to you to get the results of your procedure. Ask your health care provider, or the department that is doing the procedure, when your results will be ready. Summary  Upper endoscopy is a procedure to look inside the upper GI tract.  During the procedure, an IV will be inserted into one of your veins. You may be given a medicine to help you relax.  A medicine will be used to numb your throat.  The endoscope will be passed through your mouth and down your esophagus. This information is not intended to replace advice  given to you by your health care provider. Make sure you discuss any questions you have with your health care provider. Document Released: 06/02/2000 Document Revised: 11/28/2017 Document Reviewed: 11/05/2017 Elsevier Patient Education  2020 [name].

## 2019-01-01 LAB — SARS CORONAVIRUS 2 (TAT 6-24 HRS): SARS Coronavirus 2: NEGATIVE

## 2019-01-03 ENCOUNTER — Ambulatory Visit (HOSPITAL_COMMUNITY): Payer: 59 | Admitting: Anesthesiology

## 2019-01-03 ENCOUNTER — Other Ambulatory Visit: Payer: Self-pay

## 2019-01-03 ENCOUNTER — Encounter (HOSPITAL_COMMUNITY): Payer: Self-pay | Admitting: Anesthesiology

## 2019-01-03 ENCOUNTER — Encounter (HOSPITAL_COMMUNITY)
Admission: RE | Disposition: A | Discharge status: Home / Self Care | Payer: Self-pay | Source: Home / Self Care | Attending: Internal Medicine

## 2019-01-03 ENCOUNTER — Ambulatory Visit (HOSPITAL_COMMUNITY)
Admission: RE | Admit: 2019-01-03 | Discharge: 2019-01-03 | Disposition: A | Discharge status: Home / Self Care | Payer: 59 | Attending: Internal Medicine | Admitting: Internal Medicine

## 2019-01-03 DIAGNOSIS — K222 Esophageal obstruction: Secondary | ICD-10-CM

## 2019-01-03 DIAGNOSIS — K449 Diaphragmatic hernia without obstruction or gangrene: Secondary | ICD-10-CM | POA: Insufficient documentation

## 2019-01-03 DIAGNOSIS — R1310 Dysphagia, unspecified: Secondary | ICD-10-CM | POA: Insufficient documentation

## 2019-01-03 DIAGNOSIS — R1314 Dysphagia, pharyngoesophageal phase: Secondary | ICD-10-CM | POA: Insufficient documentation

## 2019-01-03 DIAGNOSIS — Z79899 Other long term (current) drug therapy: Secondary | ICD-10-CM | POA: Insufficient documentation

## 2019-01-03 DIAGNOSIS — K210 Gastro-esophageal reflux disease with esophagitis: Secondary | ICD-10-CM

## 2019-01-03 DIAGNOSIS — M1990 Unspecified osteoarthritis, unspecified site: Secondary | ICD-10-CM | POA: Diagnosis not present

## 2019-01-03 HISTORY — PX: ESOPHAGEAL DILATION: SHX303

## 2019-01-03 HISTORY — PX: ESOPHAGOGASTRODUODENOSCOPY (EGD) WITH PROPOFOL: SHX5813

## 2019-01-03 SURGERY — ESOPHAGOGASTRODUODENOSCOPY (EGD) WITH PROPOFOL
Anesthesia: General

## 2019-01-03 MED ORDER — KETAMINE HCL 50 MG/5ML IJ SOSY
PREFILLED_SYRINGE | INTRAMUSCULAR | Status: AC
  Filled 2019-01-03: qty 5

## 2019-01-03 MED ORDER — PROPOFOL 500 MG/50ML IV EMUL
INTRAVENOUS | Status: DC | PRN
  Administered 2019-01-03: 150 ug/kg/min via INTRAVENOUS

## 2019-01-03 MED ORDER — LACTATED RINGERS IV SOLN
INTRAVENOUS | Status: DC | PRN
  Administered 2019-01-03: 10:00:00 via INTRAVENOUS

## 2019-01-03 MED ORDER — GLYCOPYRROLATE 0.2 MG/ML IJ SOLN
INTRAMUSCULAR | Status: DC | PRN
  Administered 2019-01-03: 0.2 mg via INTRAVENOUS

## 2019-01-03 MED ORDER — PROPOFOL 10 MG/ML IV BOLUS
INTRAVENOUS | Status: DC | PRN
  Administered 2019-01-03: 10 mg via INTRAVENOUS

## 2019-01-03 MED ORDER — CHLORHEXIDINE GLUCONATE CLOTH 2 % EX PADS: 6.0000 | MEDICATED_PAD | Freq: Once | CUTANEOUS | Status: DC

## 2019-01-03 MED ORDER — LIDOCAINE HCL (CARDIAC) PF 100 MG/5ML IV SOSY
PREFILLED_SYRINGE | INTRAVENOUS | Status: DC | PRN
  Administered 2019-01-03: 40 mg via INTRAVENOUS

## 2019-01-03 MED ORDER — KETAMINE HCL 10 MG/ML IJ SOLN
INTRAMUSCULAR | Status: DC | PRN
  Administered 2019-01-03: 5 mg via INTRAVENOUS
  Administered 2019-01-03: 10 mg via INTRAVENOUS

## 2019-01-03 MED ORDER — PANTOPRAZOLE SODIUM 40 MG PO TBEC: 40.0000 mg | DELAYED_RELEASE_TABLET | Freq: Every day | ORAL | 1 refills | Status: DC

## 2019-01-03 MED ORDER — LACTATED RINGERS IV SOLN: INTRAVENOUS | Status: DC

## 2019-01-03 NOTE — Anesthesia Postprocedure Evaluation (Signed)
Anesthesia Post Note  Patient: Annette Vance  Procedure(s) Performed: ESOPHAGOGASTRODUODENOSCOPY (EGD) WITH PROPOFOL (N/A ) ESOPHAGEAL DILATION (N/A )  Patient location during evaluation: PACU Anesthesia Type: General Level of consciousness: awake and alert and oriented Pain management: pain level controlled Vital Signs Assessment: post-procedure vital signs reviewed and stable Respiratory status: spontaneous breathing Cardiovascular status: stable Postop Assessment: no apparent nausea or vomiting Anesthetic complications: no     Last Vitals:  Vitals:   01/03/19 0952 01/03/19 1055  BP: 107/63 104/67  Pulse: 66 77  Resp: (!) 22 15  Temp: 36.6 C (!) (P) 36.4 C  SpO2: 98% 94%    Last Pain:  Vitals:   01/03/19 1036  TempSrc:   PainSc: 0-No pain                 Prentiss Polio A

## 2019-01-03 NOTE — Discharge Instructions (Signed)
No aspirin or NSAIDs for 72 hours. Resume usual medications as before. Pantoprazole 40 mg by mouth 30 minutes before breakfast daily for 3 months and thereafter every other day. Resume usual diet. No driving for 24 hours. Please call office with progress report next week.  PATIENT INSTRUCTIONS POST-ANESTHESIA  IMMEDIATELY FOLLOWING SURGERY:  Do not drive or operate machinery for the first twenty four hours after surgery.  Do not make any important decisions for twenty four hours after surgery or while taking narcotic pain medications or sedatives.  If you develop intractable nausea and vomiting or a severe headache please notify your doctor immediately.  FOLLOW-UP:  Please make an appointment with your surgeon as instructed. You do not need to follow up with anesthesia unless specifically instructed to do so.  WOUND CARE INSTRUCTIONS (if applicable):  Keep a dry clean dressing on the anesthesia/puncture wound site if there is drainage.  Once the wound has quit draining you may leave it open to air.  Generally you should leave the bandage intact for twenty four hours unless there is drainage.  If the epidural site drains for more than 36-48 hours please call the anesthesia department.  QUESTIONS?:  Please feel free to call your physician or the hospital operator if you have any questions, and they will be happy to assist you.     Esophageal Dilatation Esophageal dilatation, also called esophageal dilation, is a procedure to widen or open (dilate) a blocked or narrowed part of the esophagus. The esophagus is the part of the body that moves food and liquid from the mouth to the stomach. You may need this procedure if:  You have a buildup of scar tissue in your esophagus that makes it difficult, painful, or impossible to swallow. This can be caused by gastroesophageal reflux disease (GERD).  You have cancer of the esophagus.  There is a problem with how food moves through your esophagus. In  some cases, you may need this procedure repeated at a later time to dilate the esophagus gradually. Tell a health care provider about:  Any allergies you have.  All medicines you are taking, including vitamins, herbs, eye drops, creams, and over-the-counter medicines.  Any problems you or family members have had with anesthetic medicines.  Any blood disorders you have.  Any surgeries you have had.  Any medical conditions you have.  Any antibiotic medicines you are required to take before dental procedures.  Whether you are pregnant or may be pregnant. What are the risks? Generally, this is a safe procedure. However, problems may occur, including:  Bleeding due to a tear in the lining of the esophagus.  A hole (perforation) in the esophagus. What happens before the procedure?  Follow instructions from your health care provider about eating or drinking restrictions.  Ask your health care provider about changing or stopping your regular medicines. This is especially important if you are taking diabetes medicines or blood thinners.  Plan to have someone take you home from the hospital or clinic.  Plan to have a responsible adult care for you for at least 24 hours after you leave the hospital or clinic. This is important. What happens during the procedure?  You may be given a medicine to help you relax (sedative).  A numbing medicine may be sprayed into the back of your throat, or you may gargle the medicine.  Your health care provider may perform the dilatation using various surgical instruments, such as: ? Simple dilators. This instrument is carefully placed  in the esophagus to stretch it. ? Guided wire bougies. This involves using an endoscope to insert a wire into the esophagus. A dilator is passed over this wire to enlarge the esophagus. Then the wire is removed. ? Balloon dilators. An endoscope with a small balloon at the end is inserted into the esophagus. The balloon is  inflated to stretch the esophagus and open it up. The procedure may vary among health care providers and hospitals. What happens after the procedure?  Your blood pressure, heart rate, breathing rate, and blood oxygen level will be monitored until the medicines you were given have worn off.  Your throat may feel slightly sore and numb. This will improve slowly over time.  You will not be allowed to eat or drink until your throat is no longer numb.  When you are able to drink, urinate, and sit on the edge of the bed without nausea or dizziness, you may be able to return home. Follow these instructions at home:  Take over-the-counter and prescription medicines only as told by your health care provider.  Do not drive for 24 hours if you were given a sedative during your procedure.  You should have a responsible adult with you for 24 hours after the procedure.  Follow instructions from your health care provider about any eating or drinking restrictions.  Do not use any products that contain nicotine or tobacco, such as cigarettes and e-cigarettes. If you need help quitting, ask your health care provider.  Keep all follow-up visits as told by your health care provider. This is important. Get help right away if you:  Have a fever.  Have chest pain.  Have pain that is not relieved by medication.  Have trouble breathing.  Have trouble swallowing.  Vomit blood. Summary  Esophageal dilatation, also called esophageal dilation, is a procedure to widen or open (dilate) a blocked or narrowed part of the esophagus.  Plan to have someone take you home from the hospital or clinic.  For this procedure, a numbing medicine may be sprayed into the back of your throat, or you may gargle the medicine.  Do not drive for 24 hours if you were given a sedative during your procedure. This information is not intended to replace advice given to you by your health care provider. Make sure you discuss  any questions you have with your health care provider. Document Released: 07/27/2005 Document Revised: 05/18/2017 Document Reviewed: 04/10/2017 Elsevier Patient Education  2020 [place].   Upper Endoscopy, Adult, Care After This sheet gives you information about how to care for yourself after your procedure. Your health care provider may also give you more specific instructions. If you have problems or questions, contact your health care provider. What can I expect after the procedure? After the procedure, it is common to have:  A sore throat.  Mild stomach pain or discomfort.  Bloating.  Nausea. Follow these instructions at home:   Follow instructions from your health care provider about what to eat or drink after your procedure.  Return to your normal activities as told by your health care provider. Ask your health care provider what activities are safe for you.  Take over-the-counter and prescription medicines only as told by your health care provider.  Do not drive for 24 hours if you were given a sedative during your procedure.  Keep all follow-up visits as told by your health care provider. This is important. Contact a health care provider if you have:  A  sore throat that lasts longer than one day.  Trouble swallowing. Get help right away if:  You vomit blood or your vomit looks like coffee grounds.  You have: ? A fever. ? Bloody, black, or tarry stools. ? A severe sore throat or you cannot swallow. ? Difficulty breathing. ? Severe pain in your chest or abdomen. Summary  After the procedure, it is common to have a sore throat, mild stomach discomfort, bloating, and nausea.  Do not drive for 24 hours if you were given a sedative during the procedure.  Follow instructions from your health care provider about what to eat or drink after your procedure.  Return to your normal activities as told by your health care provider. This information is not intended  to replace advice given to you by your health care provider. Make sure you discuss any questions you have with your health care provider. Document Released: 12/05/2011 Document Revised: 11/27/2017 Document Reviewed: 11/05/2017 Elsevier Patient Education  2020 [name].

## 2019-01-03 NOTE — Op Note (Signed)
[facility] Patient Name: Annette Vance Procedure Date: 01/03/2019 10:38 AM MRN: [id number] Date of Birth: [birth year] Attending MD: Hildred Laser , MD CSN: [id number] Age: 63 Admit Type: Outpatient Procedure:                Upper GI endoscopy Indications:              Esophageal dysphagia Providers:                Hildred Laser, MD, Jeanann Lewandowsky. Sharon Seller, RN, Nelma Rothman, Technician Referring MD:             Halford Chessman, MD Medicines:                Propofol per Anesthesia Complications:            No immediate complications. Estimated Blood Loss:     Estimated blood loss was minimal. Procedure:                Pre-Anesthesia Assessment:                           - Prior to the procedure, a History and Physical                            was performed, and patient medications and                            allergies were reviewed. The patient's tolerance of                            previous anesthesia was also reviewed. The risks                            and benefits of the procedure and the sedation                            options and risks were discussed with the patient.                            All questions were answered, and informed consent                            was obtained. Prior Anticoagulants: The patient has                            taken no previous anticoagulant or antiplatelet                            agents. ASA Grade Assessment: II - A patient with                            mild systemic disease. After reviewing the risks  and benefits, the patient was deemed in                            satisfactory condition to undergo the procedure.                           After obtaining informed consent, the endoscope was                            passed under direct vision. Throughout the                            procedure, the patient's blood pressure, pulse, and                            oxygen  saturations were monitored continuously. The                            GIF-H190 ([id number]) scope was introduced through the                            mouth, and advanced to the second part of duodenum.                            The upper GI endoscopy was accomplished without                            difficulty. The patient tolerated the procedure                            well. Scope In: 10:41:08 AM Scope Out: 10:50:11 AM Total Procedure Duration: 0 hours 9 minutes 3 seconds  Findings:      The examined esophagus was normal.      A moderate Schatzki ring was found at the gastroesophageal junction. A       TTS dilator was passed through the scope. Dilation with a 15-16.5-18 mm       balloon dilator was performed to 15 mm, 16.5 mm and 18 mm. The dilation       site was examined and showed mild mucosal disruption, moderate       improvement in luminal narrowing and no perforation.      LA Grade A (one or more mucosal breaks less than 5 mm, not extending       between tops of 2 mucosal folds) esophagitis was found 36 cm from the       incisors.      A 2 cm hiatal hernia was present.      The entire examined stomach was normal.      The duodenal bulb and second portion of the duodenum were normal. Impression:               - Normal esophagus.                           - Moderate Schatzki ring. Dilated.                           -  LA Grade A reflux esophagitis.                           - 2 cm hiatal hernia.                           - Normal stomach.                           - Normal duodenal bulb and second portion of the                            duodenum.                           - No specimens collected. Moderate Sedation:      Per Anesthesia Care Recommendation:           - Patient has a contact number available for                            emergencies. The signs and symptoms of potential                            delayed complications were discussed with the                             patient. Return to normal activities tomorrow.                            Written discharge instructions were provided to the                            patient.                           - Resume previous diet today.                           - Continue present medications.                           - Pantoprazole 40 mg po qam for 3 months and then                            qod.                           - No aspirin, ibuprofen, naproxen, or other                            non-steroidal anti-inflammatory drugs for 3 days.                           - Telephone GI clinic in 1 week. Procedure Code(s):        --- Professional ---  [phone], Esophagogastroduodenoscopy, flexible,                            transoral; with transendoscopic balloon dilation of                            esophagus (less than 30 mm diameter) Diagnosis Code(s):        --- Professional ---                           K22.2, Esophageal obstruction                           K21.0, Gastro-esophageal reflux disease with                            esophagitis                           K44.9, Diaphragmatic hernia without obstruction or                            gangrene                           R13.14, Dysphagia, pharyngoesophageal phase CPT copyright 2019 American Medical Association. All rights reserved. The codes documented in this report are preliminary and upon coder review may  be revised to meet current compliance requirements. Hildred Laser, MD Hildred Laser, MD 01/03/2019 10:59:24 AM This report has been signed electronically. Number of Addenda: 0

## 2019-01-03 NOTE — Anesthesia Procedure Notes (Signed)
Procedure Name: General with mask airway Performed by: Andree Elk Laxmi Choung A, CRNA Pre-anesthesia Checklist: Patient identified, Emergency Drugs available, Suction available, Timeout performed and Patient being monitored Patient Re-evaluated:Patient Re-evaluated prior to induction Oxygen Delivery Method: Non-rebreather mask

## 2019-01-03 NOTE — Anesthesia Preprocedure Evaluation (Signed)
Anesthesia Evaluation    Airway        Dental   Pulmonary           Cardiovascular      Neuro/Psych negative neurological ROS  negative psych ROS   GI/Hepatic GERD  Medicated,  Endo/Other    Renal/GU negative Renal ROS  negative genitourinary   Musculoskeletal   Abdominal   Peds  Hematology negative hematology ROS (+)   Anesthesia Other Findings   Reproductive/Obstetrics                             Anesthesia Physical Anesthesia Plan  ASA: II  Anesthesia Plan: General   Post-op Pain Management:    Induction:   PONV Risk Score and Plan: 2 and TIVA  Airway Management Planned:   Additional Equipment:   Intra-op Plan:   Post-operative Plan:   Informed Consent: I have reviewed the patients History and Physical, chart, labs and discussed the procedure including the risks, benefits and alternatives for the proposed anesthesia with the patient or authorized representative who has indicated his/her understanding and acceptance.       Plan Discussed with: CRNA  Anesthesia Plan Comments:         Anesthesia Quick Evaluation

## 2019-01-03 NOTE — Transfer of Care (Signed)
Immediate Anesthesia Transfer of Care Note  Patient: Annette Vance  Procedure(s) Performed: ESOPHAGOGASTRODUODENOSCOPY (EGD) WITH PROPOFOL (N/A ) ESOPHAGEAL DILATION (N/A )  Patient Location: PACU  Anesthesia Type:General  Level of Consciousness: awake, alert , oriented and patient cooperative  Airway & Oxygen Therapy: Patient Spontanous Breathing  Post-op Assessment: Report given to RN and Post -op Vital signs reviewed and stable  Post vital signs: Reviewed and stable  Last Vitals:  Vitals Value Taken Time  BP 104/67 01/03/19 1055  Temp    Pulse 75 01/03/19 1057  Resp 15 01/03/19 1057  SpO2 93 % 01/03/19 1057  Vitals shown include unvalidated device data.  Last Pain:  Vitals:   01/03/19 1036  TempSrc:   PainSc: 0-No pain         Complications: No apparent anesthesia complications

## 2019-01-03 NOTE — H&P (Signed)
Annette Vance is an 63 y.o. female.   Chief Complaint: Patient is here for EGD and ED. HPI: Patient 63 year old Caucasian female who has history of Schatzki's ring which was last dilated 5 years ago who presents with 3 months history of dysphagia to solids.  She had an episode of food impaction about 4 weeks ago relieved with regurgitation.  She says she has difficulty with meats and breads.  She generally is able to wait and food bolus passes down within few minutes.  She does not take pantoprazole often as she does not have much heartburn.  She is watching her diet.  She denies nausea vomiting epigastric pain or melena.  She has lost 5 or 6 pounds recently but it is voluntary loss.  Past Medical History:  Diagnosis Date  . Arthritis   . Bradycardia   . Chest discomfort   . Dysphagia   . GERD (gastroesophageal reflux disease)   . Heart palpitations     Past Surgical History:  Procedure Laterality Date  . BALLOON DILATION N/A 12/25/2013   Procedure: BALLOON DILATION;  Surgeon: Rogene Houston, MD;  [facility];  Service: Endoscopy;  Laterality: N/A;  . BIOPSY  12/25/2013   Procedure: BIOPSY;  Surgeon: Rogene Houston, MD;  [facility];  Service: Endoscopy;;  . COLONOSCOPY N/A 07/29/2014   Procedure: COLONOSCOPY;  Surgeon: Rogene Houston, MD;  [facility];  Service: Endoscopy;  Laterality: N/A;  1025 - moved to 9:25 - Ann to notify pt  . ESOPHAGOGASTRODUODENOSCOPY N/A 12/25/2013   Procedure: ESOPHAGOGASTRODUODENOSCOPY (EGD);  Surgeon: Rogene Houston, MD;  [facility];  Service: Endoscopy;  Laterality: N/A;  1255  . HEMORRHOID SURGERY     Laser Surgery with Dr. Romona Curls  . HEMORRHOID SURGERY N/A 07/10/2018   Procedure: EXTENSIVE HEMORRHOIDECTOMY;  Surgeon: Virl Cagey, MD;  Location: [facility];  Service: General;  Laterality: N/A;  . KNEE SURGERY Left    1996  . WRIST SURGERY Left    dequavains    Family History  Problem Relation Age of  Onset  . Dementia Father   . Stroke Paternal Grandfather   . Hypertension Paternal Grandfather   . Diabetes Brother    Social History:  reports that she has never smoked. She has never used smokeless tobacco. She reports current alcohol use. She reports that she does not use drugs.  Allergies: No Known Allergies  Medications Prior to Admission  Medication Sig Dispense Refill  . diclofenac sodium (VOLTAREN) 1 % GEL Apply 2 g topically daily as needed (joint pain).    Marland Kitchen loratadine (CLARITIN) 10 MG tablet Take 10 mg by mouth daily as needed for allergies.    . mometasone (NASONEX) 50 MCG/ACT nasal spray Place 2 sprays into the nose daily as needed.    Marland Kitchen omega-3 acid ethyl esters (LOVAZA) 1 g capsule Take 1 g by mouth daily.     . Vitamin D-Vitamin K (VITAMIN K2-VITAMIN D3 PO) Take 1 tablet by mouth daily.      No results found for this or any previous visit (from the past 48 hour(s)). No results found.  ROS  Blood pressure 107/63, pulse 66, temperature 97.8 F (36.6 C), temperature source Oral, resp. rate (!) 22, SpO2 98 %. Physical Exam  Constitutional: She appears well-developed and well-nourished.  HENT:  Mouth/Throat: Oropharynx is clear and moist.  Eyes: Conjunctivae are normal. No scleral icterus.  Neck: No thyromegaly present.  Cardiovascular: Normal  rate, regular rhythm and normal heart sounds.  No murmur heard. Respiratory: Effort normal and breath sounds normal.  GI: Soft. She exhibits no distension and no mass. There is no abdominal tenderness.  Musculoskeletal:        General: No edema.  Lymphadenopathy:    She has no cervical adenopathy.  Neurological: She is alert.  Skin: Skin is warm and dry.     Assessment/Plan Esophageal dysphagia. History of Schatzki's ring. EGD with ED.  Hildred Laser, MD 01/03/2019, 10:32 AM

## 2019-01-10 ENCOUNTER — Encounter (HOSPITAL_COMMUNITY): Payer: Self-pay | Admitting: Internal Medicine

## 2019-05-01 DIAGNOSIS — M19011 Primary osteoarthritis, right shoulder: Secondary | ICD-10-CM | POA: Diagnosis not present

## 2019-05-01 DIAGNOSIS — M25511 Pain in right shoulder: Secondary | ICD-10-CM | POA: Diagnosis not present

## 2019-06-30 DIAGNOSIS — M19011 Primary osteoarthritis, right shoulder: Secondary | ICD-10-CM | POA: Diagnosis not present

## 2019-06-30 DIAGNOSIS — M25511 Pain in right shoulder: Secondary | ICD-10-CM | POA: Diagnosis not present

## 2019-07-02 ENCOUNTER — Other Ambulatory Visit (INDEPENDENT_AMBULATORY_CARE_PROVIDER_SITE_OTHER): Payer: Self-pay | Admitting: Internal Medicine

## 2019-07-04 DIAGNOSIS — M7591 Shoulder lesion, unspecified, right shoulder: Secondary | ICD-10-CM | POA: Diagnosis not present

## 2019-07-04 DIAGNOSIS — M25811 Other specified joint disorders, right shoulder: Secondary | ICD-10-CM | POA: Diagnosis not present

## 2019-07-04 DIAGNOSIS — M25511 Pain in right shoulder: Secondary | ICD-10-CM | POA: Diagnosis not present

## 2019-07-04 DIAGNOSIS — M19011 Primary osteoarthritis, right shoulder: Secondary | ICD-10-CM | POA: Diagnosis not present

## 2019-07-09 DIAGNOSIS — M19011 Primary osteoarthritis, right shoulder: Secondary | ICD-10-CM | POA: Diagnosis not present

## 2019-07-09 DIAGNOSIS — M7501 Adhesive capsulitis of right shoulder: Secondary | ICD-10-CM | POA: Diagnosis not present

## 2019-07-09 DIAGNOSIS — M7541 Impingement syndrome of right shoulder: Secondary | ICD-10-CM | POA: Diagnosis not present

## 2019-07-09 DIAGNOSIS — S43431D Superior glenoid labrum lesion of right shoulder, subsequent encounter: Secondary | ICD-10-CM | POA: Diagnosis not present

## 2019-07-25 DIAGNOSIS — Z01818 Encounter for other preprocedural examination: Secondary | ICD-10-CM | POA: Diagnosis not present

## 2019-07-29 DIAGNOSIS — S43431A Superior glenoid labrum lesion of right shoulder, initial encounter: Secondary | ICD-10-CM | POA: Diagnosis not present

## 2019-07-29 DIAGNOSIS — M75101 Unspecified rotator cuff tear or rupture of right shoulder, not specified as traumatic: Secondary | ICD-10-CM | POA: Diagnosis not present

## 2019-07-29 DIAGNOSIS — M7521 Bicipital tendinitis, right shoulder: Secondary | ICD-10-CM | POA: Diagnosis not present

## 2019-07-29 DIAGNOSIS — M7501 Adhesive capsulitis of right shoulder: Secondary | ICD-10-CM | POA: Diagnosis not present

## 2019-07-29 DIAGNOSIS — K219 Gastro-esophageal reflux disease without esophagitis: Secondary | ICD-10-CM | POA: Diagnosis not present

## 2019-07-29 DIAGNOSIS — M19011 Primary osteoarthritis, right shoulder: Secondary | ICD-10-CM | POA: Diagnosis not present

## 2019-07-29 DIAGNOSIS — M75111 Incomplete rotator cuff tear or rupture of right shoulder, not specified as traumatic: Secondary | ICD-10-CM | POA: Diagnosis not present

## 2019-07-29 DIAGNOSIS — S46811A Strain of other muscles, fascia and tendons at shoulder and upper arm level, right arm, initial encounter: Secondary | ICD-10-CM | POA: Diagnosis not present

## 2019-08-01 DIAGNOSIS — M7501 Adhesive capsulitis of right shoulder: Secondary | ICD-10-CM | POA: Diagnosis not present

## 2019-08-01 DIAGNOSIS — M19011 Primary osteoarthritis, right shoulder: Secondary | ICD-10-CM | POA: Diagnosis not present

## 2019-08-01 DIAGNOSIS — S43431D Superior glenoid labrum lesion of right shoulder, subsequent encounter: Secondary | ICD-10-CM | POA: Diagnosis not present

## 2019-08-01 DIAGNOSIS — M7541 Impingement syndrome of right shoulder: Secondary | ICD-10-CM | POA: Diagnosis not present

## 2019-08-01 DIAGNOSIS — M19012 Primary osteoarthritis, left shoulder: Secondary | ICD-10-CM | POA: Diagnosis not present

## 2019-08-11 DIAGNOSIS — M19011 Primary osteoarthritis, right shoulder: Secondary | ICD-10-CM | POA: Diagnosis not present

## 2019-08-11 DIAGNOSIS — M7501 Adhesive capsulitis of right shoulder: Secondary | ICD-10-CM | POA: Diagnosis not present

## 2019-08-11 DIAGNOSIS — S43431A Superior glenoid labrum lesion of right shoulder, initial encounter: Secondary | ICD-10-CM | POA: Diagnosis not present

## 2019-08-19 DIAGNOSIS — L821 Other seborrheic keratosis: Secondary | ICD-10-CM | POA: Diagnosis not present

## 2019-08-19 DIAGNOSIS — M19012 Primary osteoarthritis, left shoulder: Secondary | ICD-10-CM | POA: Diagnosis not present

## 2019-08-19 DIAGNOSIS — L570 Actinic keratosis: Secondary | ICD-10-CM | POA: Diagnosis not present

## 2019-08-19 DIAGNOSIS — S43431D Superior glenoid labrum lesion of right shoulder, subsequent encounter: Secondary | ICD-10-CM | POA: Diagnosis not present

## 2019-08-19 DIAGNOSIS — D1801 Hemangioma of skin and subcutaneous tissue: Secondary | ICD-10-CM | POA: Diagnosis not present

## 2019-08-19 DIAGNOSIS — M7541 Impingement syndrome of right shoulder: Secondary | ICD-10-CM | POA: Diagnosis not present

## 2019-08-19 DIAGNOSIS — M19011 Primary osteoarthritis, right shoulder: Secondary | ICD-10-CM | POA: Diagnosis not present

## 2019-08-19 DIAGNOSIS — M7501 Adhesive capsulitis of right shoulder: Secondary | ICD-10-CM | POA: Diagnosis not present

## 2019-09-02 DIAGNOSIS — K1379 Other lesions of oral mucosa: Secondary | ICD-10-CM | POA: Diagnosis not present

## 2019-09-02 DIAGNOSIS — Z1389 Encounter for screening for other disorder: Secondary | ICD-10-CM | POA: Diagnosis not present

## 2019-09-02 DIAGNOSIS — Z6823 Body mass index (BMI) 23.0-23.9, adult: Secondary | ICD-10-CM | POA: Diagnosis not present

## 2019-09-25 DIAGNOSIS — Z0000 Encounter for general adult medical examination without abnormal findings: Secondary | ICD-10-CM | POA: Diagnosis not present

## 2019-09-25 DIAGNOSIS — Z0001 Encounter for general adult medical examination with abnormal findings: Secondary | ICD-10-CM | POA: Diagnosis not present

## 2019-09-25 DIAGNOSIS — Z6823 Body mass index (BMI) 23.0-23.9, adult: Secondary | ICD-10-CM | POA: Diagnosis not present

## 2019-09-25 DIAGNOSIS — E782 Mixed hyperlipidemia: Secondary | ICD-10-CM | POA: Diagnosis not present

## 2019-11-28 DIAGNOSIS — K219 Gastro-esophageal reflux disease without esophagitis: Secondary | ICD-10-CM | POA: Diagnosis not present

## 2019-11-28 DIAGNOSIS — K0500 Acute gingivitis, plaque induced: Secondary | ICD-10-CM | POA: Diagnosis not present

## 2019-11-28 DIAGNOSIS — K0510 Chronic gingivitis, plaque induced: Secondary | ICD-10-CM | POA: Diagnosis not present

## 2019-12-17 ENCOUNTER — Other Ambulatory Visit (INDEPENDENT_AMBULATORY_CARE_PROVIDER_SITE_OTHER): Payer: Self-pay | Admitting: Gastroenterology

## 2020-01-15 DIAGNOSIS — B09 Unspecified viral infection characterized by skin and mucous membrane lesions: Secondary | ICD-10-CM | POA: Diagnosis not present

## 2020-01-15 DIAGNOSIS — Z6823 Body mass index (BMI) 23.0-23.9, adult: Secondary | ICD-10-CM | POA: Diagnosis not present

## 2020-03-01 DIAGNOSIS — Z9889 Other specified postprocedural states: Secondary | ICD-10-CM | POA: Diagnosis not present

## 2020-03-01 DIAGNOSIS — H25813 Combined forms of age-related cataract, bilateral: Secondary | ICD-10-CM | POA: Diagnosis not present

## 2020-03-15 DIAGNOSIS — M25561 Pain in right knee: Secondary | ICD-10-CM | POA: Diagnosis not present

## 2020-06-10 ENCOUNTER — Other Ambulatory Visit (INDEPENDENT_AMBULATORY_CARE_PROVIDER_SITE_OTHER): Payer: Self-pay

## 2020-06-10 MED ORDER — PANTOPRAZOLE SODIUM 40 MG PO TBEC: DELAYED_RELEASE_TABLET | ORAL | 0 refills | Status: DC

## 2020-06-17 ENCOUNTER — Other Ambulatory Visit (INDEPENDENT_AMBULATORY_CARE_PROVIDER_SITE_OTHER): Payer: Self-pay | Admitting: Gastroenterology

## 2020-09-01 DIAGNOSIS — Z1283 Encounter for screening for malignant neoplasm of skin: Secondary | ICD-10-CM | POA: Diagnosis not present

## 2020-09-01 DIAGNOSIS — L570 Actinic keratosis: Secondary | ICD-10-CM | POA: Diagnosis not present

## 2020-09-01 DIAGNOSIS — L814 Other melanin hyperpigmentation: Secondary | ICD-10-CM | POA: Diagnosis not present

## 2020-09-01 DIAGNOSIS — I781 Nevus, non-neoplastic: Secondary | ICD-10-CM | POA: Diagnosis not present

## 2020-10-19 ENCOUNTER — Other Ambulatory Visit (INDEPENDENT_AMBULATORY_CARE_PROVIDER_SITE_OTHER): Payer: Self-pay | Admitting: Gastroenterology

## 2020-11-08 ENCOUNTER — Other Ambulatory Visit (HOSPITAL_COMMUNITY): Payer: Self-pay | Admitting: Physician Assistant

## 2020-11-08 DIAGNOSIS — E2839 Other primary ovarian failure: Secondary | ICD-10-CM

## 2020-11-08 DIAGNOSIS — Z0000 Encounter for general adult medical examination without abnormal findings: Secondary | ICD-10-CM | POA: Diagnosis not present

## 2020-11-08 DIAGNOSIS — Z6823 Body mass index (BMI) 23.0-23.9, adult: Secondary | ICD-10-CM | POA: Diagnosis not present

## 2020-11-08 DIAGNOSIS — Z1389 Encounter for screening for other disorder: Secondary | ICD-10-CM | POA: Diagnosis not present

## 2020-11-08 DIAGNOSIS — Z1331 Encounter for screening for depression: Secondary | ICD-10-CM | POA: Diagnosis not present

## 2020-11-24 ENCOUNTER — Other Ambulatory Visit: Payer: Self-pay

## 2020-11-24 ENCOUNTER — Ambulatory Visit (HOSPITAL_COMMUNITY)
Admission: RE | Admit: 2020-11-24 | Discharge: 2020-11-24 | Disposition: A | Discharge status: Home / Self Care | Payer: PPO | Source: Ambulatory Visit | Attending: Physician Assistant | Admitting: Physician Assistant

## 2020-11-24 DIAGNOSIS — E2839 Other primary ovarian failure: Secondary | ICD-10-CM | POA: Diagnosis not present

## 2020-11-24 DIAGNOSIS — M8589 Other specified disorders of bone density and structure, multiple sites: Secondary | ICD-10-CM | POA: Diagnosis not present

## 2021-01-16 ENCOUNTER — Other Ambulatory Visit (INDEPENDENT_AMBULATORY_CARE_PROVIDER_SITE_OTHER): Payer: Self-pay | Admitting: Gastroenterology

## 2021-01-17 ENCOUNTER — Other Ambulatory Visit (INDEPENDENT_AMBULATORY_CARE_PROVIDER_SITE_OTHER): Payer: Self-pay | Admitting: Gastroenterology

## 2021-02-18 ENCOUNTER — Other Ambulatory Visit (INDEPENDENT_AMBULATORY_CARE_PROVIDER_SITE_OTHER): Payer: Self-pay | Admitting: Gastroenterology

## 2021-02-18 NOTE — Telephone Encounter (Signed)
Last seen 12/16/18 for dysphagia. 30 day supply given last month with a note needs office visit. No visit scheduled.

## 2021-02-28 DIAGNOSIS — D485 Neoplasm of uncertain behavior of skin: Secondary | ICD-10-CM | POA: Diagnosis not present

## 2021-02-28 DIAGNOSIS — D0439 Carcinoma in situ of skin of other parts of face: Secondary | ICD-10-CM | POA: Diagnosis not present

## 2021-03-03 DIAGNOSIS — C44329 Squamous cell carcinoma of skin of other parts of face: Secondary | ICD-10-CM | POA: Diagnosis not present

## 2021-03-04 DIAGNOSIS — H04123 Dry eye syndrome of bilateral lacrimal glands: Secondary | ICD-10-CM | POA: Diagnosis not present

## 2021-03-04 DIAGNOSIS — H25812 Combined forms of age-related cataract, left eye: Secondary | ICD-10-CM | POA: Diagnosis not present

## 2021-03-04 DIAGNOSIS — H25811 Combined forms of age-related cataract, right eye: Secondary | ICD-10-CM | POA: Diagnosis not present

## 2021-03-17 DIAGNOSIS — H2511 Age-related nuclear cataract, right eye: Secondary | ICD-10-CM | POA: Diagnosis not present

## 2021-03-17 DIAGNOSIS — H25812 Combined forms of age-related cataract, left eye: Secondary | ICD-10-CM | POA: Diagnosis not present

## 2021-03-17 DIAGNOSIS — H25811 Combined forms of age-related cataract, right eye: Secondary | ICD-10-CM | POA: Diagnosis not present

## 2021-03-17 DIAGNOSIS — Z01818 Encounter for other preprocedural examination: Secondary | ICD-10-CM | POA: Diagnosis not present

## 2021-03-25 DIAGNOSIS — H524 Presbyopia: Secondary | ICD-10-CM | POA: Diagnosis not present

## 2021-03-25 DIAGNOSIS — H2512 Age-related nuclear cataract, left eye: Secondary | ICD-10-CM | POA: Diagnosis not present

## 2021-03-25 DIAGNOSIS — H25812 Combined forms of age-related cataract, left eye: Secondary | ICD-10-CM | POA: Diagnosis not present

## 2021-05-10 DIAGNOSIS — J22 Unspecified acute lower respiratory infection: Secondary | ICD-10-CM | POA: Diagnosis not present

## 2021-05-17 ENCOUNTER — Ambulatory Visit (INDEPENDENT_AMBULATORY_CARE_PROVIDER_SITE_OTHER): Payer: PPO | Admitting: Gastroenterology

## 2021-06-06 DIAGNOSIS — Z1231 Encounter for screening mammogram for malignant neoplasm of breast: Secondary | ICD-10-CM | POA: Diagnosis not present

## 2021-06-22 ENCOUNTER — Encounter (INDEPENDENT_AMBULATORY_CARE_PROVIDER_SITE_OTHER): Payer: Self-pay | Admitting: Gastroenterology

## 2021-06-22 ENCOUNTER — Other Ambulatory Visit (INDEPENDENT_AMBULATORY_CARE_PROVIDER_SITE_OTHER): Payer: Self-pay

## 2021-06-22 ENCOUNTER — Encounter (INDEPENDENT_AMBULATORY_CARE_PROVIDER_SITE_OTHER): Payer: Self-pay

## 2021-06-22 ENCOUNTER — Other Ambulatory Visit: Payer: Self-pay

## 2021-06-22 ENCOUNTER — Ambulatory Visit (INDEPENDENT_AMBULATORY_CARE_PROVIDER_SITE_OTHER): Payer: PPO | Admitting: Gastroenterology

## 2021-06-22 VITALS — BP 101/69 | HR 84 | Temp 98.3°F | Ht 66.0 in | Wt 155.2 lb

## 2021-06-22 DIAGNOSIS — K219 Gastro-esophageal reflux disease without esophagitis: Secondary | ICD-10-CM | POA: Insufficient documentation

## 2021-06-22 DIAGNOSIS — K2100 Gastro-esophageal reflux disease with esophagitis, without bleeding: Secondary | ICD-10-CM

## 2021-06-22 DIAGNOSIS — R1319 Other dysphagia: Secondary | ICD-10-CM | POA: Diagnosis not present

## 2021-06-22 MED ORDER — PANTOPRAZOLE SODIUM 40 MG PO TBEC: 40.0000 mg | DELAYED_RELEASE_TABLET | Freq: Every day | ORAL | 3 refills | Status: DC

## 2021-06-22 NOTE — H&P (View-Only) (Signed)
Maylon Peppers, M.D. Gastroenterology & Hepatology [facility] [address] [place], [place] [address]  Primary Care Physician: Sharilyn Sites, MD [address] [place] [address]  I will communicate my assessment and recommendations to the referring MD via EMR.  Problems: GERD History of Schatzki's ring  History of Present Illness: Annette Vance is a 66 y.o. female with past medical history of GERD, history of Schatzki's ring, who presents for follow up of GERD and dysphagia.  The patient was last seen on 12/16/2018. At that time, the patient was scheduled to undergo an EGD due to recurrent dysphagia.  EGD was performed on 12/2018 with findings described below.  She was started on pantoprazole 40 mg every day.  Patient was taking pantoprazole 40 mg every day since her last EGD in 2020. States she ran out of her prescription in 02/2021 and states that since then she has felt recurrent episodes of dysphagia, especially when eating chicken, beef, cornbread, grainy food and carrots. She has required to vomit the food multiple times since then. She denies any heartburn. Has taken famotidine as needed once a day, but has not felt it helps improving her dysphagia. She does not eat any spicy food. No odynophagia.  Patient states that she has been concerned as she has heard in the news about potential side effects with the intake of PPI chronically.  The patient denies having any nausea, vomiting, fever, chills, hematochezia, melena, hematemesis, abdominal distention, abdominal pain, diarrhea, jaundice, pruritus or weight loss.  Last EGD: 12/2018  - Normal esophagus. - Moderate Schatzki ring. Dilated with a 15-16.5-18 mm balloon dilator was performed to 15 mm, 16.5 mm and 18 mm. The dilation site was examined and showed mild mucosal disruption, moderate improvement in luminal narrowing and no perforation. - LA Grade A reflux  esophagitis. - 2 cm hiatal hernia. - Normal stomach. - Normal duodenal bulb and second portion of the duodenum.  Last Colonoscopy: 2016 Prep excellent. Redundant colon. Normal mucosa of cecum, ascending colon, hepatic flexure, transverse colon, splenic flexure, descending and sigmoid colon. Normal rectal mucosa. Prominent hemorrhoids below the dentate line  Past Medical History: Past Medical History:  Diagnosis Date   Arthritis    Bradycardia    Chest discomfort    Dysphagia    GERD (gastroesophageal reflux disease)    Heart palpitations     Past Surgical History: Past Surgical History:  Procedure Laterality Date   BALLOON DILATION N/A 12/25/2013   Procedure: BALLOON DILATION;  Surgeon: Rogene Houston, MD;  [facility];  Service: Endoscopy;  Laterality: N/A;   BIOPSY  12/25/2013   Procedure: BIOPSY;  Surgeon: Rogene Houston, MD;  [facility];  Service: Endoscopy;;   CATARACT EXTRACTION Bilateral    one week apart. last week of september and first week of october   COLONOSCOPY N/A 07/29/2014   Procedure: COLONOSCOPY;  Surgeon: Rogene Houston, MD;  [facility];  Service: Endoscopy;  Laterality: N/A;  1025 - moved to 9:25 - Ann to notify pt   ESOPHAGEAL DILATION N/A 01/03/2019   Procedure: ESOPHAGEAL DILATION;  Surgeon: Rogene Houston, MD;  [facility];  Service: Endoscopy;  Laterality: N/A;   ESOPHAGOGASTRODUODENOSCOPY N/A 12/25/2013   Procedure: ESOPHAGOGASTRODUODENOSCOPY (EGD);  Surgeon: Rogene Houston, MD;  [facility];  Service: Endoscopy;  Laterality: N/A;  1255   ESOPHAGOGASTRODUODENOSCOPY (EGD) WITH PROPOFOL N/A 01/03/2019   Procedure: ESOPHAGOGASTRODUODENOSCOPY (EGD) WITH  PROPOFOL;  Surgeon: Rogene Houston, MD;  [facility];  Service: Endoscopy;  Laterality: N/A;  12:30   HEMORRHOID SURGERY     Laser Surgery with Dr. Romona Curls   HEMORRHOID SURGERY N/A 07/10/2018   Procedure: EXTENSIVE  HEMORRHOIDECTOMY;  Surgeon: Virl Cagey, MD;  [facility]: [facility];  Service: General;  Laterality: N/A;   KNEE SURGERY Left    1996   SHOULDER ARTHROSCOPY Right 07/2018   WRIST SURGERY Left    dequavains    Family History: Family History  Problem Relation Age of Onset   Dementia Father    Stroke Paternal Grandfather    Hypertension Paternal Grandfather    Diabetes Brother     Social History: Social History   Tobacco Use  Smoking Status Never  Smokeless Tobacco Never   Social History   Substance and Sexual Activity  Alcohol Use Yes   Comment: occasional   Social History   Substance and Sexual Activity  Drug Use No    Allergies: No Known Allergies  Medications: Current Outpatient Medications  Medication Sig Dispense Refill   mometasone (NASONEX) 50 MCG/ACT nasal spray Place 2 sprays into the nose daily as needed.     omega-3 acid ethyl esters (LOVAZA) 1 g capsule Take 1 g by mouth daily.      Vitamin D-Vitamin K (VITAMIN K2-VITAMIN D3 PO) Take 1 tablet by mouth daily.     No current facility-administered medications for this visit.    Review of Systems: GENERAL: negative for malaise, night sweats HEENT: No changes in hearing or vision, no nose bleeds or other nasal problems. NECK: Negative for lumps, goiter, pain and significant neck swelling RESPIRATORY: Negative for cough, wheezing CARDIOVASCULAR: Negative for chest pain, leg swelling, palpitations, orthopnea GI: SEE HPI MUSCULOSKELETAL: Negative for joint pain or swelling, back pain, and muscle pain. SKIN: Negative for lesions, rash PSYCH: Negative for sleep disturbance, mood disorder and recent psychosocial stressors. HEMATOLOGY Negative for prolonged bleeding, bruising easily, and swollen nodes. ENDOCRINE: Negative for cold or heat intolerance, polyuria, polydipsia and goiter. NEURO: negative for tremor, gait imbalance, syncope and seizures. The remainder of the review of systems is  noncontributory.   Physical Exam: BP 101/69 (BP Location: Left Arm, Patient Position: Sitting, Cuff Size: Large)    Pulse 84    Temp 98.3 F (36.8 C) (Oral)    Ht 5\' 6"  (1.676 m)    Wt 155 lb 3.2 oz (70.4 kg)    BMI 25.05 kg/m  GENERAL: The patient is AO x3, in no acute distress. HEENT: Head is normocephalic and atraumatic. EOMI are intact. Mouth is well hydrated and without lesions. NECK: Supple. No masses LUNGS: Clear to auscultation. No presence of rhonchi/wheezing/rales. Adequate chest expansion HEART: RRR, normal s1 and s2. ABDOMEN: Soft, nontender, no guarding, no peritoneal signs, and nondistended. BS +. No masses. EXTREMITIES: Without any cyanosis, clubbing, rash, lesions or edema. NEUROLOGIC: AOx3, no focal motor deficit. SKIN: no jaundice, no rashes  Imaging/Labs: as above  I personally reviewed and interpreted the available labs, imaging and endoscopic files.  Impression and Plan: Annette Vance is a 66 y.o. female with past medical history of GERD, history of Schatzki's ring, who presents for follow up of GERD and dysphagia.  The patient has presented recurrent episodes dysphagia in the setting of underlying GERD.  She has now presented GERD related symptoms but given the previous endoscopic findings, it is very likely she has developed recurrent strictures/Schatzki's ring which will require to have  a repeat endoscopic evaluation and potential dilation. Patient was re-assured regarding PPI use and safety of when appropriate indications in question. Most recent studies on PPI therapy that association of symptoms is not equivalent to causation and overall association with for example osteoporosis is weak and based on observational studies. When PPI use is indicated, it is safe to proceed with therapy and titrate dosing/use based on symptom response.  Due to this, we will start her back on pantoprazole 40 mg every day.  We may consider decreasing the dose in the future if she remains  asymptomatic.  - Schedule EGD with ED - Start Protonix 40 mg qday - RTC 1 year  All questions were answered.      Harvel Quale, MD Gastroenterology and Hepatology [facility]

## 2021-06-22 NOTE — Patient Instructions (Signed)
Schedule EGD Start Protonix 40 mg qday

## 2021-06-22 NOTE — Progress Notes (Signed)
Maylon Peppers, M.D. Gastroenterology & Hepatology [facility] [address] [place], [place] [address]  Primary Care Physician: Sharilyn Sites, MD [address] [place] [address]  I will communicate my assessment and recommendations to the referring MD via EMR.  Problems: GERD History of Schatzki's ring  History of Present Illness: Annette Vance is a 66 y.o. female with past medical history of GERD, history of Schatzki's ring, who presents for follow up of GERD and dysphagia.  The patient was last seen on 12/16/2018. At that time, the patient was scheduled to undergo an EGD due to recurrent dysphagia.  EGD was performed on 12/2018 with findings described below.  She was started on pantoprazole 40 mg every day.  Patient was taking pantoprazole 40 mg every day since her last EGD in 2020. States she ran out of her prescription in 02/2021 and states that since then she has felt recurrent episodes of dysphagia, especially when eating chicken, beef, cornbread, grainy food and carrots. She has required to vomit the food multiple times since then. She denies any heartburn. Has taken famotidine as needed once a day, but has not felt it helps improving her dysphagia. She does not eat any spicy food. No odynophagia.  Patient states that she has been concerned as she has heard in the news about potential side effects with the intake of PPI chronically.  The patient denies having any nausea, vomiting, fever, chills, hematochezia, melena, hematemesis, abdominal distention, abdominal pain, diarrhea, jaundice, pruritus or weight loss.  Last EGD: 12/2018  - Normal esophagus. - Moderate Schatzki ring. Dilated with a 15-16.5-18 mm balloon dilator was performed to 15 mm, 16.5 mm and 18 mm. The dilation site was examined and showed mild mucosal disruption, moderate improvement in luminal narrowing and no perforation. - LA Grade A reflux  esophagitis. - 2 cm hiatal hernia. - Normal stomach. - Normal duodenal bulb and second portion of the duodenum.  Last Colonoscopy: 2016 Prep excellent. Redundant colon. Normal mucosa of cecum, ascending colon, hepatic flexure, transverse colon, splenic flexure, descending and sigmoid colon. Normal rectal mucosa. Prominent hemorrhoids below the dentate line  Past Medical History: Past Medical History:  Diagnosis Date   Arthritis    Bradycardia    Chest discomfort    Dysphagia    GERD (gastroesophageal reflux disease)    Heart palpitations     Past Surgical History: Past Surgical History:  Procedure Laterality Date   BALLOON DILATION N/A 12/25/2013   Procedure: BALLOON DILATION;  Surgeon: Rogene Houston, MD;  [facility];  Service: Endoscopy;  Laterality: N/A;   BIOPSY  12/25/2013   Procedure: BIOPSY;  Surgeon: Rogene Houston, MD;  [facility];  Service: Endoscopy;;   CATARACT EXTRACTION Bilateral    one week apart. last week of september and first week of october   COLONOSCOPY N/A 07/29/2014   Procedure: COLONOSCOPY;  Surgeon: Rogene Houston, MD;  [facility];  Service: Endoscopy;  Laterality: N/A;  1025 - moved to 9:25 - Ann to notify pt   ESOPHAGEAL DILATION N/A 01/03/2019   Procedure: ESOPHAGEAL DILATION;  Surgeon: Rogene Houston, MD;  [facility];  Service: Endoscopy;  Laterality: N/A;   ESOPHAGOGASTRODUODENOSCOPY N/A 12/25/2013   Procedure: ESOPHAGOGASTRODUODENOSCOPY (EGD);  Surgeon: Rogene Houston, MD;  [facility];  Service: Endoscopy;  Laterality: N/A;  1255   ESOPHAGOGASTRODUODENOSCOPY (EGD) WITH PROPOFOL N/A 01/03/2019   Procedure: ESOPHAGOGASTRODUODENOSCOPY (EGD) WITH  PROPOFOL;  Surgeon: Rogene Houston, MD;  [facility];  Service: Endoscopy;  Laterality: N/A;  12:30   HEMORRHOID SURGERY     Laser Surgery with Dr. Romona Curls   HEMORRHOID SURGERY N/A 07/10/2018   Procedure: EXTENSIVE  HEMORRHOIDECTOMY;  Surgeon: Virl Cagey, MD;  Location: [facility];  Service: General;  Laterality: N/A;   KNEE SURGERY Left    1996   SHOULDER ARTHROSCOPY Right 07/2018   WRIST SURGERY Left    dequavains    Family History: Family History  Problem Relation Age of Onset   Dementia Father    Stroke Paternal Grandfather    Hypertension Paternal Grandfather    Diabetes Brother     Social History: Social History   Tobacco Use  Smoking Status Never  Smokeless Tobacco Never   Social History   Substance and Sexual Activity  Alcohol Use Yes   Comment: occasional   Social History   Substance and Sexual Activity  Drug Use No    Allergies: No Known Allergies  Medications: Current Outpatient Medications  Medication Sig Dispense Refill   mometasone (NASONEX) 50 MCG/ACT nasal spray Place 2 sprays into the nose daily as needed.     omega-3 acid ethyl esters (LOVAZA) 1 g capsule Take 1 g by mouth daily.      Vitamin D-Vitamin K (VITAMIN K2-VITAMIN D3 PO) Take 1 tablet by mouth daily.     No current facility-administered medications for this visit.    Review of Systems: GENERAL: negative for malaise, night sweats HEENT: No changes in hearing or vision, no nose bleeds or other nasal problems. NECK: Negative for lumps, goiter, pain and significant neck swelling RESPIRATORY: Negative for cough, wheezing CARDIOVASCULAR: Negative for chest pain, leg swelling, palpitations, orthopnea GI: SEE HPI MUSCULOSKELETAL: Negative for joint pain or swelling, back pain, and muscle pain. SKIN: Negative for lesions, rash PSYCH: Negative for sleep disturbance, mood disorder and recent psychosocial stressors. HEMATOLOGY Negative for prolonged bleeding, bruising easily, and swollen nodes. ENDOCRINE: Negative for cold or heat intolerance, polyuria, polydipsia and goiter. NEURO: negative for tremor, gait imbalance, syncope and seizures. The remainder of the review of systems is  noncontributory.   Physical Exam: BP 101/69 (BP Location: Left Arm, Patient Position: Sitting, Cuff Size: Large)    Pulse 84    Temp 98.3 F (36.8 C) (Oral)    Ht 5\' 6"  (1.676 m)    Wt 155 lb 3.2 oz (70.4 kg)    BMI 25.05 kg/m  GENERAL: The patient is AO x3, in no acute distress. HEENT: Head is normocephalic and atraumatic. EOMI are intact. Mouth is well hydrated and without lesions. NECK: Supple. No masses LUNGS: Clear to auscultation. No presence of rhonchi/wheezing/rales. Adequate chest expansion HEART: RRR, normal s1 and s2. ABDOMEN: Soft, nontender, no guarding, no peritoneal signs, and nondistended. BS +. No masses. EXTREMITIES: Without any cyanosis, clubbing, rash, lesions or edema. NEUROLOGIC: AOx3, no focal motor deficit. SKIN: no jaundice, no rashes  Imaging/Labs: as above  I personally reviewed and interpreted the available labs, imaging and endoscopic files.  Impression and Plan: Annette Vance is a 66 y.o. female with past medical history of GERD, history of Schatzki's ring, who presents for follow up of GERD and dysphagia.  The patient has presented recurrent episodes dysphagia in the setting of underlying GERD.  She has now presented GERD related symptoms but given the previous endoscopic findings, it is very likely she has developed recurrent strictures/Schatzki's ring which will require to have  a repeat endoscopic evaluation and potential dilation. Patient was re-assured regarding PPI use and safety of when appropriate indications in question. Most recent studies on PPI therapy that association of symptoms is not equivalent to causation and overall association with for example osteoporosis is weak and based on observational studies. When PPI use is indicated, it is safe to proceed with therapy and titrate dosing/use based on symptom response.  Due to this, we will start her back on pantoprazole 40 mg every day.  We may consider decreasing the dose in the future if she remains  asymptomatic.  - Schedule EGD with ED - Start Protonix 40 mg qday - RTC 1 year  All questions were answered.      Harvel Quale, MD Gastroenterology and Hepatology [facility]

## 2021-06-28 DIAGNOSIS — M19011 Primary osteoarthritis, right shoulder: Secondary | ICD-10-CM | POA: Diagnosis not present

## 2021-06-28 DIAGNOSIS — M25611 Stiffness of right shoulder, not elsewhere classified: Secondary | ICD-10-CM | POA: Diagnosis not present

## 2021-06-29 ENCOUNTER — Encounter (HOSPITAL_COMMUNITY)
Admission: RE | Disposition: A | Discharge status: Home / Self Care | Payer: Self-pay | Source: Home / Self Care | Attending: Gastroenterology

## 2021-06-29 ENCOUNTER — Ambulatory Visit (HOSPITAL_COMMUNITY): Payer: PPO | Admitting: Anesthesiology

## 2021-06-29 ENCOUNTER — Encounter (HOSPITAL_COMMUNITY): Payer: Self-pay | Admitting: Gastroenterology

## 2021-06-29 ENCOUNTER — Ambulatory Visit (HOSPITAL_COMMUNITY)
Admission: RE | Admit: 2021-06-29 | Discharge: 2021-06-29 | Disposition: A | Discharge status: Home / Self Care | Payer: PPO | Attending: Gastroenterology | Admitting: Gastroenterology

## 2021-06-29 ENCOUNTER — Other Ambulatory Visit: Payer: Self-pay

## 2021-06-29 DIAGNOSIS — Z79899 Other long term (current) drug therapy: Secondary | ICD-10-CM | POA: Insufficient documentation

## 2021-06-29 DIAGNOSIS — K222 Esophageal obstruction: Secondary | ICD-10-CM | POA: Diagnosis not present

## 2021-06-29 DIAGNOSIS — R1310 Dysphagia, unspecified: Secondary | ICD-10-CM | POA: Insufficient documentation

## 2021-06-29 DIAGNOSIS — K2100 Gastro-esophageal reflux disease with esophagitis, without bleeding: Secondary | ICD-10-CM | POA: Diagnosis not present

## 2021-06-29 DIAGNOSIS — K449 Diaphragmatic hernia without obstruction or gangrene: Secondary | ICD-10-CM | POA: Diagnosis not present

## 2021-06-29 DIAGNOSIS — R002 Palpitations: Secondary | ICD-10-CM | POA: Diagnosis not present

## 2021-06-29 HISTORY — PX: ESOPHAGEAL DILATION: SHX303

## 2021-06-29 HISTORY — PX: ESOPHAGOGASTRODUODENOSCOPY (EGD) WITH PROPOFOL: SHX5813

## 2021-06-29 SURGERY — ESOPHAGOGASTRODUODENOSCOPY (EGD) WITH PROPOFOL
Anesthesia: General

## 2021-06-29 MED ORDER — PROPOFOL 10 MG/ML IV BOLUS
INTRAVENOUS | Status: DC | PRN
  Administered 2021-06-29: 100 mg via INTRAVENOUS

## 2021-06-29 MED ORDER — PROPOFOL 500 MG/50ML IV EMUL
INTRAVENOUS | Status: DC | PRN
  Administered 2021-06-29: 150 ug/kg/min via INTRAVENOUS

## 2021-06-29 MED ORDER — LIDOCAINE HCL (CARDIAC) PF 100 MG/5ML IV SOSY
PREFILLED_SYRINGE | INTRAVENOUS | Status: DC | PRN
  Administered 2021-06-29: 50 mg via INTRAVENOUS

## 2021-06-29 MED ORDER — LACTATED RINGERS IV SOLN: INTRAVENOUS | Status: DC

## 2021-06-29 NOTE — Anesthesia Postprocedure Evaluation (Signed)
Anesthesia Post Note  Patient: Annette Vance  Procedure(s) Performed: ESOPHAGOGASTRODUODENOSCOPY (EGD) WITH PROPOFOL ESOPHAGEAL DILATION  Patient location during evaluation: Phase II Anesthesia Type: General Level of consciousness: awake Pain management: pain level controlled Vital Signs Assessment: post-procedure vital signs reviewed and stable Respiratory status: spontaneous breathing and respiratory function stable Cardiovascular status: blood pressure returned to baseline and stable Postop Assessment: no headache and no apparent nausea or vomiting Anesthetic complications: no Comments: Late entry   No notable events documented.   Last Vitals:  Vitals:   06/29/21 1007 06/29/21 1010  BP: (!) 85/48 91/63  Pulse: 63   Resp: 17   Temp: (!) 36.4 C   SpO2: 95%     Last Pain:  Vitals:   06/29/21 1007  TempSrc: Axillary  PainSc: 0-No pain                 Louann Sjogren

## 2021-06-29 NOTE — Anesthesia Preprocedure Evaluation (Signed)
Anesthesia Evaluation  Patient identified by MRN, date of birth, ID band Patient awake    Reviewed: Allergy & Precautions, H&P , NPO status , Patient's Chart, lab work & pertinent test results, reviewed documented beta blocker date and time   Airway Mallampati: II  TM Distance: >3 FB Neck ROM: full    Dental no notable dental hx.    Pulmonary neg pulmonary ROS,    Pulmonary exam normal breath sounds clear to auscultation       Cardiovascular Exercise Tolerance: Good negative cardio ROS   Rhythm:regular Rate:Normal     Neuro/Psych negative neurological ROS  negative psych ROS   GI/Hepatic Neg liver ROS, GERD  Medicated,  Endo/Other  negative endocrine ROS  Renal/GU negative Renal ROS  negative genitourinary   Musculoskeletal   Abdominal   Peds  Hematology negative hematology ROS (+)   Anesthesia Other Findings   Reproductive/Obstetrics negative OB ROS                             Anesthesia Physical Anesthesia Plan  ASA: 2  Anesthesia Plan: General   Post-op Pain Management:    Induction:   PONV Risk Score and Plan: Propofol infusion  Airway Management Planned:   Additional Equipment:   Intra-op Plan:   Post-operative Plan:   Informed Consent: I have reviewed the patients History and Physical, chart, labs and discussed the procedure including the risks, benefits and alternatives for the proposed anesthesia with the patient or authorized representative who has indicated his/her understanding and acceptance.     Dental Advisory Given  Plan Discussed with: CRNA  Anesthesia Plan Comments:         Anesthesia Quick Evaluation  

## 2021-06-29 NOTE — Discharge Instructions (Signed)
You are being discharged to home.  Resume your previous diet.  Continue your present medications.  

## 2021-06-29 NOTE — Transfer of Care (Signed)
Immediate Anesthesia Transfer of Care Note  Patient: Annette Vance  Procedure(s) Performed: ESOPHAGOGASTRODUODENOSCOPY (EGD) WITH PROPOFOL ESOPHAGEAL DILATION  Patient Location: Endoscopy Unit  Anesthesia Type:General  Level of Consciousness: drowsy  Airway & Oxygen Therapy: Patient Spontanous Breathing  Post-op Assessment: Report given to RN and Post -op Vital signs reviewed and stable  Post vital signs: Reviewed and stable  Last Vitals:  Vitals Value Taken Time  BP    Temp    Pulse    Resp    SpO2      Last Pain:  Vitals:   06/29/21 0953  TempSrc:   PainSc: 0-No pain      Patients Stated Pain Goal: 7 (79/02/40 9735)  Complications: No notable events documented.

## 2021-06-29 NOTE — Interval H&P Note (Signed)
History and Physical Interval Note:  06/29/2021 8:04 AM  Annette Vance  has presented today for surgery, with the diagnosis of Dysphagia GERD.  The various methods of treatment have been discussed with the patient and family. After consideration of risks, benefits and other options for treatment, the patient has consented to  Procedure(s) with comments: ESOPHAGOGASTRODUODENOSCOPY (EGD) WITH PROPOFOL (N/A) - 9:00 ESOPHAGEAL DILATION (N/A) as a surgical intervention.  The patient's history has been reviewed, patient examined, no change in status, stable for surgery.  I have reviewed the patient's chart and labs.  Questions were answered to the patient's satisfaction.     Annette Vance

## 2021-06-29 NOTE — Anesthesia Procedure Notes (Signed)
Date/Time: 06/29/2021 10:01 AM Performed by: Orlie Dakin, CRNA Pre-anesthesia Checklist: Patient identified, Emergency Drugs available, Suction available and Patient being monitored Patient Re-evaluated:Patient Re-evaluated prior to induction Oxygen Delivery Method: Nasal cannula Induction Type: IV induction Placement Confirmation: positive ETCO2

## 2021-06-29 NOTE — Op Note (Signed)
[facility] Patient Name: Annette Vance Procedure Date: 06/29/2021 9:34 AM MRN: [id number] Date of Birth: 22-Jul-1955 Attending MD: Maylon Peppers ,  CSN: [id number] Age: 66 Admit Type: Outpatient Procedure:                Upper GI endoscopy Indications:              Dysphagia, Follow-up of esophageal stricture Providers:                Maylon Peppers, Janeece Riggers, RN, Hughie Closs,                            RN, Kristine L. Risa Grill, Technician Referring MD:              Medicines:                Monitored Anesthesia Care Complications:            No immediate complications. Estimated Blood Loss:     Estimated blood loss: none. Procedure:                Pre-Anesthesia Assessment:                           - Prior to the procedure, a History and Physical                            was performed, and patient medications, allergies                            and sensitivities were reviewed. The patient's                            tolerance of previous anesthesia was reviewed.                           - The risks and benefits of the procedure and the                            sedation options and risks were discussed with the                            patient. All questions were answered and informed                            consent was obtained.                           - ASA Grade Assessment: II - A patient with mild                            systemic disease.                           After obtaining informed consent, the endoscope was                            passed under direct  vision. Throughout the                            procedure, the patient's blood pressure, pulse, and                            oxygen saturations were monitored continuously. The                            GIF-H190 ([id number]) scope was introduced through the                            mouth, and advanced to the second part of duodenum.                            The upper GI endoscopy  was accomplished without                            difficulty. The patient tolerated the procedure                            well. Scope In: 9:56:24 AM Scope Out: 10:04:10 AM Total Procedure Duration: 0 hours 7 minutes 46 seconds  Findings:      A non-obstructing Schatzki ring was found at the gastroesophageal       junction. This was initially disrupted with a forceps. A TTS dilator was       passed through the scope. Dilation with a 15-16.5-18 mm balloon dilator       was performed to 18 mm.      A 1 cm hiatal hernia was present.      The entire examined stomach was normal.      The examined duodenum was normal. Impression:               - Non-obstructing Schatzki ring. Dilated.                           - 1 cm hiatal hernia.                           - Normal stomach.                           - Normal examined duodenum.                           - No specimens collected. Moderate Sedation:      Per Anesthesia Care Recommendation:           - Discharge patient to home (ambulatory).                           - Resume previous diet.                           - Continue present medications. Procedure Code(s):        --- Professional ---  [phone], Esophagogastroduodenoscopy, flexible,                            transoral; with transendoscopic balloon dilation of                            esophagus (less than 30 mm diameter) Diagnosis Code(s):        --- Professional ---                           K22.2, Esophageal obstruction                           K44.9, Diaphragmatic hernia without obstruction or                            gangrene                           R13.10, Dysphagia, unspecified CPT copyright 2019 American Medical Association. All rights reserved. The codes documented in this report are preliminary and upon coder review may  be revised to meet current compliance requirements. Maylon Peppers, MD Maylon Peppers,  06/29/2021 10:09:48  AM This report has been signed electronically. Number of Addenda: 0

## 2021-07-01 ENCOUNTER — Encounter (HOSPITAL_COMMUNITY): Payer: Self-pay | Admitting: Gastroenterology

## 2021-07-11 DIAGNOSIS — D239 Other benign neoplasm of skin, unspecified: Secondary | ICD-10-CM | POA: Diagnosis not present

## 2021-07-11 DIAGNOSIS — Z1283 Encounter for screening for malignant neoplasm of skin: Secondary | ICD-10-CM | POA: Diagnosis not present

## 2021-07-11 DIAGNOSIS — D0472 Carcinoma in situ of skin of left lower limb, including hip: Secondary | ICD-10-CM | POA: Diagnosis not present

## 2021-07-11 DIAGNOSIS — D485 Neoplasm of uncertain behavior of skin: Secondary | ICD-10-CM | POA: Diagnosis not present

## 2021-07-20 DIAGNOSIS — M47812 Spondylosis without myelopathy or radiculopathy, cervical region: Secondary | ICD-10-CM | POA: Diagnosis not present

## 2021-07-20 DIAGNOSIS — M9902 Segmental and somatic dysfunction of thoracic region: Secondary | ICD-10-CM | POA: Diagnosis not present

## 2021-07-20 DIAGNOSIS — M531 Cervicobrachial syndrome: Secondary | ICD-10-CM | POA: Diagnosis not present

## 2021-07-20 DIAGNOSIS — M9903 Segmental and somatic dysfunction of lumbar region: Secondary | ICD-10-CM | POA: Diagnosis not present

## 2021-07-20 DIAGNOSIS — S338XXA Sprain of other parts of lumbar spine and pelvis, initial encounter: Secondary | ICD-10-CM | POA: Diagnosis not present

## 2021-07-20 DIAGNOSIS — M546 Pain in thoracic spine: Secondary | ICD-10-CM | POA: Diagnosis not present

## 2021-07-20 DIAGNOSIS — M9901 Segmental and somatic dysfunction of cervical region: Secondary | ICD-10-CM | POA: Diagnosis not present

## 2021-07-21 DIAGNOSIS — C44729 Squamous cell carcinoma of skin of left lower limb, including hip: Secondary | ICD-10-CM | POA: Diagnosis not present

## 2021-07-22 DIAGNOSIS — S338XXA Sprain of other parts of lumbar spine and pelvis, initial encounter: Secondary | ICD-10-CM | POA: Diagnosis not present

## 2021-07-22 DIAGNOSIS — M47812 Spondylosis without myelopathy or radiculopathy, cervical region: Secondary | ICD-10-CM | POA: Diagnosis not present

## 2021-07-22 DIAGNOSIS — M531 Cervicobrachial syndrome: Secondary | ICD-10-CM | POA: Diagnosis not present

## 2021-07-22 DIAGNOSIS — M9903 Segmental and somatic dysfunction of lumbar region: Secondary | ICD-10-CM | POA: Diagnosis not present

## 2021-07-22 DIAGNOSIS — M9901 Segmental and somatic dysfunction of cervical region: Secondary | ICD-10-CM | POA: Diagnosis not present

## 2021-07-22 DIAGNOSIS — M9902 Segmental and somatic dysfunction of thoracic region: Secondary | ICD-10-CM | POA: Diagnosis not present

## 2021-07-22 DIAGNOSIS — M546 Pain in thoracic spine: Secondary | ICD-10-CM | POA: Diagnosis not present

## 2021-07-26 DIAGNOSIS — M9902 Segmental and somatic dysfunction of thoracic region: Secondary | ICD-10-CM | POA: Diagnosis not present

## 2021-07-26 DIAGNOSIS — M546 Pain in thoracic spine: Secondary | ICD-10-CM | POA: Diagnosis not present

## 2021-07-26 DIAGNOSIS — M9901 Segmental and somatic dysfunction of cervical region: Secondary | ICD-10-CM | POA: Diagnosis not present

## 2021-07-26 DIAGNOSIS — M47812 Spondylosis without myelopathy or radiculopathy, cervical region: Secondary | ICD-10-CM | POA: Diagnosis not present

## 2021-07-26 DIAGNOSIS — M531 Cervicobrachial syndrome: Secondary | ICD-10-CM | POA: Diagnosis not present

## 2021-07-26 DIAGNOSIS — M9903 Segmental and somatic dysfunction of lumbar region: Secondary | ICD-10-CM | POA: Diagnosis not present

## 2021-07-26 DIAGNOSIS — S338XXA Sprain of other parts of lumbar spine and pelvis, initial encounter: Secondary | ICD-10-CM | POA: Diagnosis not present

## 2021-07-29 DIAGNOSIS — M531 Cervicobrachial syndrome: Secondary | ICD-10-CM | POA: Diagnosis not present

## 2021-07-29 DIAGNOSIS — S338XXA Sprain of other parts of lumbar spine and pelvis, initial encounter: Secondary | ICD-10-CM | POA: Diagnosis not present

## 2021-07-29 DIAGNOSIS — M47812 Spondylosis without myelopathy or radiculopathy, cervical region: Secondary | ICD-10-CM | POA: Diagnosis not present

## 2021-07-29 DIAGNOSIS — M9902 Segmental and somatic dysfunction of thoracic region: Secondary | ICD-10-CM | POA: Diagnosis not present

## 2021-07-29 DIAGNOSIS — M9903 Segmental and somatic dysfunction of lumbar region: Secondary | ICD-10-CM | POA: Diagnosis not present

## 2021-07-29 DIAGNOSIS — M546 Pain in thoracic spine: Secondary | ICD-10-CM | POA: Diagnosis not present

## 2021-07-29 DIAGNOSIS — M9901 Segmental and somatic dysfunction of cervical region: Secondary | ICD-10-CM | POA: Diagnosis not present

## 2021-08-01 DIAGNOSIS — M546 Pain in thoracic spine: Secondary | ICD-10-CM | POA: Diagnosis not present

## 2021-08-01 DIAGNOSIS — M47812 Spondylosis without myelopathy or radiculopathy, cervical region: Secondary | ICD-10-CM | POA: Diagnosis not present

## 2021-08-01 DIAGNOSIS — M531 Cervicobrachial syndrome: Secondary | ICD-10-CM | POA: Diagnosis not present

## 2021-08-01 DIAGNOSIS — M9901 Segmental and somatic dysfunction of cervical region: Secondary | ICD-10-CM | POA: Diagnosis not present

## 2021-08-01 DIAGNOSIS — M9903 Segmental and somatic dysfunction of lumbar region: Secondary | ICD-10-CM | POA: Diagnosis not present

## 2021-08-01 DIAGNOSIS — M9902 Segmental and somatic dysfunction of thoracic region: Secondary | ICD-10-CM | POA: Diagnosis not present

## 2021-08-01 DIAGNOSIS — S338XXA Sprain of other parts of lumbar spine and pelvis, initial encounter: Secondary | ICD-10-CM | POA: Diagnosis not present

## 2021-08-04 DIAGNOSIS — M531 Cervicobrachial syndrome: Secondary | ICD-10-CM | POA: Diagnosis not present

## 2021-08-04 DIAGNOSIS — S338XXA Sprain of other parts of lumbar spine and pelvis, initial encounter: Secondary | ICD-10-CM | POA: Diagnosis not present

## 2021-08-04 DIAGNOSIS — M9902 Segmental and somatic dysfunction of thoracic region: Secondary | ICD-10-CM | POA: Diagnosis not present

## 2021-08-04 DIAGNOSIS — M47812 Spondylosis without myelopathy or radiculopathy, cervical region: Secondary | ICD-10-CM | POA: Diagnosis not present

## 2021-08-04 DIAGNOSIS — M9903 Segmental and somatic dysfunction of lumbar region: Secondary | ICD-10-CM | POA: Diagnosis not present

## 2021-08-04 DIAGNOSIS — M9901 Segmental and somatic dysfunction of cervical region: Secondary | ICD-10-CM | POA: Diagnosis not present

## 2021-08-04 DIAGNOSIS — M546 Pain in thoracic spine: Secondary | ICD-10-CM | POA: Diagnosis not present

## 2021-08-11 DIAGNOSIS — M9902 Segmental and somatic dysfunction of thoracic region: Secondary | ICD-10-CM | POA: Diagnosis not present

## 2021-08-11 DIAGNOSIS — S338XXA Sprain of other parts of lumbar spine and pelvis, initial encounter: Secondary | ICD-10-CM | POA: Diagnosis not present

## 2021-08-11 DIAGNOSIS — M546 Pain in thoracic spine: Secondary | ICD-10-CM | POA: Diagnosis not present

## 2021-08-11 DIAGNOSIS — M9901 Segmental and somatic dysfunction of cervical region: Secondary | ICD-10-CM | POA: Diagnosis not present

## 2021-08-11 DIAGNOSIS — M9903 Segmental and somatic dysfunction of lumbar region: Secondary | ICD-10-CM | POA: Diagnosis not present

## 2021-08-11 DIAGNOSIS — M47812 Spondylosis without myelopathy or radiculopathy, cervical region: Secondary | ICD-10-CM | POA: Diagnosis not present

## 2021-08-11 DIAGNOSIS — M531 Cervicobrachial syndrome: Secondary | ICD-10-CM | POA: Diagnosis not present

## 2021-08-19 DIAGNOSIS — M9901 Segmental and somatic dysfunction of cervical region: Secondary | ICD-10-CM | POA: Diagnosis not present

## 2021-08-19 DIAGNOSIS — M47812 Spondylosis without myelopathy or radiculopathy, cervical region: Secondary | ICD-10-CM | POA: Diagnosis not present

## 2021-08-19 DIAGNOSIS — M546 Pain in thoracic spine: Secondary | ICD-10-CM | POA: Diagnosis not present

## 2021-08-19 DIAGNOSIS — S338XXA Sprain of other parts of lumbar spine and pelvis, initial encounter: Secondary | ICD-10-CM | POA: Diagnosis not present

## 2021-08-19 DIAGNOSIS — M9902 Segmental and somatic dysfunction of thoracic region: Secondary | ICD-10-CM | POA: Diagnosis not present

## 2021-08-19 DIAGNOSIS — M9903 Segmental and somatic dysfunction of lumbar region: Secondary | ICD-10-CM | POA: Diagnosis not present

## 2021-08-19 DIAGNOSIS — M531 Cervicobrachial syndrome: Secondary | ICD-10-CM | POA: Diagnosis not present

## 2021-08-25 DIAGNOSIS — M546 Pain in thoracic spine: Secondary | ICD-10-CM | POA: Diagnosis not present

## 2021-08-25 DIAGNOSIS — M9901 Segmental and somatic dysfunction of cervical region: Secondary | ICD-10-CM | POA: Diagnosis not present

## 2021-08-25 DIAGNOSIS — S338XXA Sprain of other parts of lumbar spine and pelvis, initial encounter: Secondary | ICD-10-CM | POA: Diagnosis not present

## 2021-08-25 DIAGNOSIS — M9903 Segmental and somatic dysfunction of lumbar region: Secondary | ICD-10-CM | POA: Diagnosis not present

## 2021-08-25 DIAGNOSIS — M47812 Spondylosis without myelopathy or radiculopathy, cervical region: Secondary | ICD-10-CM | POA: Diagnosis not present

## 2021-08-25 DIAGNOSIS — M531 Cervicobrachial syndrome: Secondary | ICD-10-CM | POA: Diagnosis not present

## 2021-08-25 DIAGNOSIS — M9902 Segmental and somatic dysfunction of thoracic region: Secondary | ICD-10-CM | POA: Diagnosis not present

## 2021-09-08 DIAGNOSIS — M546 Pain in thoracic spine: Secondary | ICD-10-CM | POA: Diagnosis not present

## 2021-09-08 DIAGNOSIS — M9901 Segmental and somatic dysfunction of cervical region: Secondary | ICD-10-CM | POA: Diagnosis not present

## 2021-09-08 DIAGNOSIS — M9903 Segmental and somatic dysfunction of lumbar region: Secondary | ICD-10-CM | POA: Diagnosis not present

## 2021-09-08 DIAGNOSIS — S338XXA Sprain of other parts of lumbar spine and pelvis, initial encounter: Secondary | ICD-10-CM | POA: Diagnosis not present

## 2021-09-08 DIAGNOSIS — M47812 Spondylosis without myelopathy or radiculopathy, cervical region: Secondary | ICD-10-CM | POA: Diagnosis not present

## 2021-09-08 DIAGNOSIS — M531 Cervicobrachial syndrome: Secondary | ICD-10-CM | POA: Diagnosis not present

## 2021-09-08 DIAGNOSIS — M9902 Segmental and somatic dysfunction of thoracic region: Secondary | ICD-10-CM | POA: Diagnosis not present

## 2021-09-22 DIAGNOSIS — M9901 Segmental and somatic dysfunction of cervical region: Secondary | ICD-10-CM | POA: Diagnosis not present

## 2021-09-22 DIAGNOSIS — M9902 Segmental and somatic dysfunction of thoracic region: Secondary | ICD-10-CM | POA: Diagnosis not present

## 2021-09-22 DIAGNOSIS — M546 Pain in thoracic spine: Secondary | ICD-10-CM | POA: Diagnosis not present

## 2021-09-22 DIAGNOSIS — S338XXA Sprain of other parts of lumbar spine and pelvis, initial encounter: Secondary | ICD-10-CM | POA: Diagnosis not present

## 2021-09-22 DIAGNOSIS — M9903 Segmental and somatic dysfunction of lumbar region: Secondary | ICD-10-CM | POA: Diagnosis not present

## 2021-09-22 DIAGNOSIS — M47812 Spondylosis without myelopathy or radiculopathy, cervical region: Secondary | ICD-10-CM | POA: Diagnosis not present

## 2021-09-22 DIAGNOSIS — M531 Cervicobrachial syndrome: Secondary | ICD-10-CM | POA: Diagnosis not present

## 2021-10-06 DIAGNOSIS — M546 Pain in thoracic spine: Secondary | ICD-10-CM | POA: Diagnosis not present

## 2021-10-06 DIAGNOSIS — S338XXA Sprain of other parts of lumbar spine and pelvis, initial encounter: Secondary | ICD-10-CM | POA: Diagnosis not present

## 2021-10-06 DIAGNOSIS — M9903 Segmental and somatic dysfunction of lumbar region: Secondary | ICD-10-CM | POA: Diagnosis not present

## 2021-10-06 DIAGNOSIS — M531 Cervicobrachial syndrome: Secondary | ICD-10-CM | POA: Diagnosis not present

## 2021-10-06 DIAGNOSIS — M9902 Segmental and somatic dysfunction of thoracic region: Secondary | ICD-10-CM | POA: Diagnosis not present

## 2021-10-06 DIAGNOSIS — M47812 Spondylosis without myelopathy or radiculopathy, cervical region: Secondary | ICD-10-CM | POA: Diagnosis not present

## 2021-10-06 DIAGNOSIS — M9901 Segmental and somatic dysfunction of cervical region: Secondary | ICD-10-CM | POA: Diagnosis not present

## 2021-10-24 DIAGNOSIS — M546 Pain in thoracic spine: Secondary | ICD-10-CM | POA: Diagnosis not present

## 2021-10-24 DIAGNOSIS — M9903 Segmental and somatic dysfunction of lumbar region: Secondary | ICD-10-CM | POA: Diagnosis not present

## 2021-10-24 DIAGNOSIS — M9902 Segmental and somatic dysfunction of thoracic region: Secondary | ICD-10-CM | POA: Diagnosis not present

## 2021-10-24 DIAGNOSIS — M9901 Segmental and somatic dysfunction of cervical region: Secondary | ICD-10-CM | POA: Diagnosis not present

## 2021-10-24 DIAGNOSIS — M47812 Spondylosis without myelopathy or radiculopathy, cervical region: Secondary | ICD-10-CM | POA: Diagnosis not present

## 2021-10-24 DIAGNOSIS — S338XXA Sprain of other parts of lumbar spine and pelvis, initial encounter: Secondary | ICD-10-CM | POA: Diagnosis not present

## 2021-10-24 DIAGNOSIS — M531 Cervicobrachial syndrome: Secondary | ICD-10-CM | POA: Diagnosis not present

## 2021-11-07 DIAGNOSIS — M9901 Segmental and somatic dysfunction of cervical region: Secondary | ICD-10-CM | POA: Diagnosis not present

## 2021-11-07 DIAGNOSIS — M531 Cervicobrachial syndrome: Secondary | ICD-10-CM | POA: Diagnosis not present

## 2021-11-07 DIAGNOSIS — M546 Pain in thoracic spine: Secondary | ICD-10-CM | POA: Diagnosis not present

## 2021-11-07 DIAGNOSIS — M9902 Segmental and somatic dysfunction of thoracic region: Secondary | ICD-10-CM | POA: Diagnosis not present

## 2021-11-07 DIAGNOSIS — M47812 Spondylosis without myelopathy or radiculopathy, cervical region: Secondary | ICD-10-CM | POA: Diagnosis not present

## 2021-11-07 DIAGNOSIS — M9903 Segmental and somatic dysfunction of lumbar region: Secondary | ICD-10-CM | POA: Diagnosis not present

## 2021-11-07 DIAGNOSIS — S338XXA Sprain of other parts of lumbar spine and pelvis, initial encounter: Secondary | ICD-10-CM | POA: Diagnosis not present

## 2021-11-18 DIAGNOSIS — M9903 Segmental and somatic dysfunction of lumbar region: Secondary | ICD-10-CM | POA: Diagnosis not present

## 2021-11-18 DIAGNOSIS — S338XXA Sprain of other parts of lumbar spine and pelvis, initial encounter: Secondary | ICD-10-CM | POA: Diagnosis not present

## 2021-11-18 DIAGNOSIS — M47812 Spondylosis without myelopathy or radiculopathy, cervical region: Secondary | ICD-10-CM | POA: Diagnosis not present

## 2021-11-18 DIAGNOSIS — M531 Cervicobrachial syndrome: Secondary | ICD-10-CM | POA: Diagnosis not present

## 2021-11-18 DIAGNOSIS — M9901 Segmental and somatic dysfunction of cervical region: Secondary | ICD-10-CM | POA: Diagnosis not present

## 2021-11-18 DIAGNOSIS — M9902 Segmental and somatic dysfunction of thoracic region: Secondary | ICD-10-CM | POA: Diagnosis not present

## 2021-11-18 DIAGNOSIS — M546 Pain in thoracic spine: Secondary | ICD-10-CM | POA: Diagnosis not present

## 2021-12-02 DIAGNOSIS — M9903 Segmental and somatic dysfunction of lumbar region: Secondary | ICD-10-CM | POA: Diagnosis not present

## 2021-12-02 DIAGNOSIS — M546 Pain in thoracic spine: Secondary | ICD-10-CM | POA: Diagnosis not present

## 2021-12-02 DIAGNOSIS — M531 Cervicobrachial syndrome: Secondary | ICD-10-CM | POA: Diagnosis not present

## 2021-12-02 DIAGNOSIS — M9902 Segmental and somatic dysfunction of thoracic region: Secondary | ICD-10-CM | POA: Diagnosis not present

## 2021-12-02 DIAGNOSIS — S338XXA Sprain of other parts of lumbar spine and pelvis, initial encounter: Secondary | ICD-10-CM | POA: Diagnosis not present

## 2021-12-02 DIAGNOSIS — M47812 Spondylosis without myelopathy or radiculopathy, cervical region: Secondary | ICD-10-CM | POA: Diagnosis not present

## 2021-12-02 DIAGNOSIS — M9901 Segmental and somatic dysfunction of cervical region: Secondary | ICD-10-CM | POA: Diagnosis not present

## 2021-12-16 DIAGNOSIS — M9901 Segmental and somatic dysfunction of cervical region: Secondary | ICD-10-CM | POA: Diagnosis not present

## 2021-12-16 DIAGNOSIS — M9902 Segmental and somatic dysfunction of thoracic region: Secondary | ICD-10-CM | POA: Diagnosis not present

## 2021-12-16 DIAGNOSIS — M9903 Segmental and somatic dysfunction of lumbar region: Secondary | ICD-10-CM | POA: Diagnosis not present

## 2021-12-16 DIAGNOSIS — M531 Cervicobrachial syndrome: Secondary | ICD-10-CM | POA: Diagnosis not present

## 2021-12-16 DIAGNOSIS — M47812 Spondylosis without myelopathy or radiculopathy, cervical region: Secondary | ICD-10-CM | POA: Diagnosis not present

## 2021-12-16 DIAGNOSIS — M546 Pain in thoracic spine: Secondary | ICD-10-CM | POA: Diagnosis not present

## 2021-12-16 DIAGNOSIS — S338XXA Sprain of other parts of lumbar spine and pelvis, initial encounter: Secondary | ICD-10-CM | POA: Diagnosis not present

## 2022-01-02 DIAGNOSIS — M9903 Segmental and somatic dysfunction of lumbar region: Secondary | ICD-10-CM | POA: Diagnosis not present

## 2022-01-02 DIAGNOSIS — M9902 Segmental and somatic dysfunction of thoracic region: Secondary | ICD-10-CM | POA: Diagnosis not present

## 2022-01-02 DIAGNOSIS — M47812 Spondylosis without myelopathy or radiculopathy, cervical region: Secondary | ICD-10-CM | POA: Diagnosis not present

## 2022-01-02 DIAGNOSIS — S338XXA Sprain of other parts of lumbar spine and pelvis, initial encounter: Secondary | ICD-10-CM | POA: Diagnosis not present

## 2022-01-02 DIAGNOSIS — M531 Cervicobrachial syndrome: Secondary | ICD-10-CM | POA: Diagnosis not present

## 2022-01-02 DIAGNOSIS — M9901 Segmental and somatic dysfunction of cervical region: Secondary | ICD-10-CM | POA: Diagnosis not present

## 2022-01-02 DIAGNOSIS — M546 Pain in thoracic spine: Secondary | ICD-10-CM | POA: Diagnosis not present

## 2022-01-16 DIAGNOSIS — M8580 Other specified disorders of bone density and structure, unspecified site: Secondary | ICD-10-CM | POA: Diagnosis not present

## 2022-01-16 DIAGNOSIS — E782 Mixed hyperlipidemia: Secondary | ICD-10-CM | POA: Diagnosis not present

## 2022-01-16 DIAGNOSIS — K219 Gastro-esophageal reflux disease without esophagitis: Secondary | ICD-10-CM | POA: Diagnosis not present

## 2022-01-30 DIAGNOSIS — M546 Pain in thoracic spine: Secondary | ICD-10-CM | POA: Diagnosis not present

## 2022-01-30 DIAGNOSIS — M9901 Segmental and somatic dysfunction of cervical region: Secondary | ICD-10-CM | POA: Diagnosis not present

## 2022-01-30 DIAGNOSIS — S338XXA Sprain of other parts of lumbar spine and pelvis, initial encounter: Secondary | ICD-10-CM | POA: Diagnosis not present

## 2022-01-30 DIAGNOSIS — M47812 Spondylosis without myelopathy or radiculopathy, cervical region: Secondary | ICD-10-CM | POA: Diagnosis not present

## 2022-01-30 DIAGNOSIS — M9902 Segmental and somatic dysfunction of thoracic region: Secondary | ICD-10-CM | POA: Diagnosis not present

## 2022-01-30 DIAGNOSIS — M9903 Segmental and somatic dysfunction of lumbar region: Secondary | ICD-10-CM | POA: Diagnosis not present

## 2022-01-30 DIAGNOSIS — M531 Cervicobrachial syndrome: Secondary | ICD-10-CM | POA: Diagnosis not present

## 2022-02-24 DIAGNOSIS — Z6824 Body mass index (BMI) 24.0-24.9, adult: Secondary | ICD-10-CM | POA: Diagnosis not present

## 2022-02-24 DIAGNOSIS — E039 Hypothyroidism, unspecified: Secondary | ICD-10-CM | POA: Diagnosis not present

## 2022-02-24 DIAGNOSIS — Z0001 Encounter for general adult medical examination with abnormal findings: Secondary | ICD-10-CM | POA: Diagnosis not present

## 2022-02-24 DIAGNOSIS — Z1331 Encounter for screening for depression: Secondary | ICD-10-CM | POA: Diagnosis not present

## 2022-02-24 DIAGNOSIS — J302 Other seasonal allergic rhinitis: Secondary | ICD-10-CM | POA: Diagnosis not present

## 2022-02-27 DIAGNOSIS — M47812 Spondylosis without myelopathy or radiculopathy, cervical region: Secondary | ICD-10-CM | POA: Diagnosis not present

## 2022-02-27 DIAGNOSIS — M9901 Segmental and somatic dysfunction of cervical region: Secondary | ICD-10-CM | POA: Diagnosis not present

## 2022-02-27 DIAGNOSIS — M546 Pain in thoracic spine: Secondary | ICD-10-CM | POA: Diagnosis not present

## 2022-02-27 DIAGNOSIS — M531 Cervicobrachial syndrome: Secondary | ICD-10-CM | POA: Diagnosis not present

## 2022-02-27 DIAGNOSIS — S338XXA Sprain of other parts of lumbar spine and pelvis, initial encounter: Secondary | ICD-10-CM | POA: Diagnosis not present

## 2022-02-27 DIAGNOSIS — M9902 Segmental and somatic dysfunction of thoracic region: Secondary | ICD-10-CM | POA: Diagnosis not present

## 2022-02-27 DIAGNOSIS — M9903 Segmental and somatic dysfunction of lumbar region: Secondary | ICD-10-CM | POA: Diagnosis not present

## 2022-03-08 DIAGNOSIS — D239 Other benign neoplasm of skin, unspecified: Secondary | ICD-10-CM | POA: Diagnosis not present

## 2022-03-08 DIAGNOSIS — L570 Actinic keratosis: Secondary | ICD-10-CM | POA: Diagnosis not present

## 2022-03-08 DIAGNOSIS — Z85828 Personal history of other malignant neoplasm of skin: Secondary | ICD-10-CM | POA: Diagnosis not present

## 2022-03-10 DIAGNOSIS — K219 Gastro-esophageal reflux disease without esophagitis: Secondary | ICD-10-CM | POA: Diagnosis not present

## 2022-03-10 DIAGNOSIS — J329 Chronic sinusitis, unspecified: Secondary | ICD-10-CM | POA: Diagnosis not present

## 2022-03-10 DIAGNOSIS — J209 Acute bronchitis, unspecified: Secondary | ICD-10-CM | POA: Diagnosis not present

## 2022-03-10 DIAGNOSIS — Z6825 Body mass index (BMI) 25.0-25.9, adult: Secondary | ICD-10-CM | POA: Diagnosis not present

## 2022-03-16 DIAGNOSIS — J209 Acute bronchitis, unspecified: Secondary | ICD-10-CM | POA: Diagnosis not present

## 2022-03-16 DIAGNOSIS — E663 Overweight: Secondary | ICD-10-CM | POA: Diagnosis not present

## 2022-03-16 DIAGNOSIS — Z6825 Body mass index (BMI) 25.0-25.9, adult: Secondary | ICD-10-CM | POA: Diagnosis not present

## 2022-03-16 DIAGNOSIS — J45901 Unspecified asthma with (acute) exacerbation: Secondary | ICD-10-CM | POA: Diagnosis not present

## 2022-03-27 DIAGNOSIS — M9901 Segmental and somatic dysfunction of cervical region: Secondary | ICD-10-CM | POA: Diagnosis not present

## 2022-03-27 DIAGNOSIS — M9902 Segmental and somatic dysfunction of thoracic region: Secondary | ICD-10-CM | POA: Diagnosis not present

## 2022-03-27 DIAGNOSIS — M531 Cervicobrachial syndrome: Secondary | ICD-10-CM | POA: Diagnosis not present

## 2022-03-27 DIAGNOSIS — M47812 Spondylosis without myelopathy or radiculopathy, cervical region: Secondary | ICD-10-CM | POA: Diagnosis not present

## 2022-03-27 DIAGNOSIS — S338XXA Sprain of other parts of lumbar spine and pelvis, initial encounter: Secondary | ICD-10-CM | POA: Diagnosis not present

## 2022-03-27 DIAGNOSIS — M546 Pain in thoracic spine: Secondary | ICD-10-CM | POA: Diagnosis not present

## 2022-03-27 DIAGNOSIS — M9903 Segmental and somatic dysfunction of lumbar region: Secondary | ICD-10-CM | POA: Diagnosis not present

## 2022-04-05 ENCOUNTER — Ambulatory Visit (HOSPITAL_COMMUNITY)
Admission: RE | Admit: 2022-04-05 | Discharge: 2022-04-05 | Disposition: A | Discharge status: Home / Self Care | Payer: PPO | Source: Ambulatory Visit | Attending: Internal Medicine | Admitting: Internal Medicine

## 2022-04-05 ENCOUNTER — Other Ambulatory Visit (HOSPITAL_COMMUNITY): Payer: Self-pay | Admitting: Internal Medicine

## 2022-04-05 DIAGNOSIS — R052 Subacute cough: Secondary | ICD-10-CM

## 2022-04-05 DIAGNOSIS — J45901 Unspecified asthma with (acute) exacerbation: Secondary | ICD-10-CM | POA: Diagnosis not present

## 2022-04-05 DIAGNOSIS — R059 Cough, unspecified: Secondary | ICD-10-CM | POA: Diagnosis not present

## 2022-04-13 DIAGNOSIS — K219 Gastro-esophageal reflux disease without esophagitis: Secondary | ICD-10-CM | POA: Diagnosis not present

## 2022-04-13 DIAGNOSIS — Z6825 Body mass index (BMI) 25.0-25.9, adult: Secondary | ICD-10-CM | POA: Diagnosis not present

## 2022-04-13 DIAGNOSIS — J189 Pneumonia, unspecified organism: Secondary | ICD-10-CM | POA: Diagnosis not present

## 2022-04-13 DIAGNOSIS — J45901 Unspecified asthma with (acute) exacerbation: Secondary | ICD-10-CM | POA: Diagnosis not present

## 2022-04-19 DIAGNOSIS — M9901 Segmental and somatic dysfunction of cervical region: Secondary | ICD-10-CM | POA: Diagnosis not present

## 2022-04-19 DIAGNOSIS — M546 Pain in thoracic spine: Secondary | ICD-10-CM | POA: Diagnosis not present

## 2022-04-19 DIAGNOSIS — M531 Cervicobrachial syndrome: Secondary | ICD-10-CM | POA: Diagnosis not present

## 2022-04-19 DIAGNOSIS — M9902 Segmental and somatic dysfunction of thoracic region: Secondary | ICD-10-CM | POA: Diagnosis not present

## 2022-04-19 DIAGNOSIS — M9903 Segmental and somatic dysfunction of lumbar region: Secondary | ICD-10-CM | POA: Diagnosis not present

## 2022-04-19 DIAGNOSIS — M47812 Spondylosis without myelopathy or radiculopathy, cervical region: Secondary | ICD-10-CM | POA: Diagnosis not present

## 2022-04-19 DIAGNOSIS — S338XXA Sprain of other parts of lumbar spine and pelvis, initial encounter: Secondary | ICD-10-CM | POA: Diagnosis not present

## 2022-04-28 DIAGNOSIS — H02831 Dermatochalasis of right upper eyelid: Secondary | ICD-10-CM | POA: Diagnosis not present

## 2022-04-28 DIAGNOSIS — H02834 Dermatochalasis of left upper eyelid: Secondary | ICD-10-CM | POA: Diagnosis not present

## 2022-04-28 DIAGNOSIS — H26493 Other secondary cataract, bilateral: Secondary | ICD-10-CM | POA: Diagnosis not present

## 2022-04-29 DIAGNOSIS — E663 Overweight: Secondary | ICD-10-CM | POA: Diagnosis not present

## 2022-04-29 DIAGNOSIS — Z6825 Body mass index (BMI) 25.0-25.9, adult: Secondary | ICD-10-CM | POA: Diagnosis not present

## 2022-04-29 DIAGNOSIS — R062 Wheezing: Secondary | ICD-10-CM | POA: Diagnosis not present

## 2022-04-30 ENCOUNTER — Encounter (INDEPENDENT_AMBULATORY_CARE_PROVIDER_SITE_OTHER): Payer: Self-pay | Admitting: Gastroenterology

## 2022-05-04 DIAGNOSIS — K219 Gastro-esophageal reflux disease without esophagitis: Secondary | ICD-10-CM | POA: Diagnosis not present

## 2022-05-04 DIAGNOSIS — J45901 Unspecified asthma with (acute) exacerbation: Secondary | ICD-10-CM | POA: Diagnosis not present

## 2022-05-04 DIAGNOSIS — J189 Pneumonia, unspecified organism: Secondary | ICD-10-CM | POA: Diagnosis not present

## 2022-05-16 ENCOUNTER — Other Ambulatory Visit (HOSPITAL_COMMUNITY): Payer: Self-pay | Admitting: Internal Medicine

## 2022-05-16 ENCOUNTER — Ambulatory Visit (HOSPITAL_COMMUNITY)
Admission: RE | Admit: 2022-05-16 | Discharge: 2022-05-16 | Disposition: A | Discharge status: Home / Self Care | Payer: PPO | Source: Ambulatory Visit | Attending: Internal Medicine | Admitting: Internal Medicine

## 2022-05-16 DIAGNOSIS — R059 Cough, unspecified: Secondary | ICD-10-CM | POA: Diagnosis not present

## 2022-05-16 DIAGNOSIS — R051 Acute cough: Secondary | ICD-10-CM | POA: Diagnosis not present

## 2022-05-17 DIAGNOSIS — M546 Pain in thoracic spine: Secondary | ICD-10-CM | POA: Diagnosis not present

## 2022-05-17 DIAGNOSIS — M531 Cervicobrachial syndrome: Secondary | ICD-10-CM | POA: Diagnosis not present

## 2022-05-17 DIAGNOSIS — M9901 Segmental and somatic dysfunction of cervical region: Secondary | ICD-10-CM | POA: Diagnosis not present

## 2022-05-17 DIAGNOSIS — M47812 Spondylosis without myelopathy or radiculopathy, cervical region: Secondary | ICD-10-CM | POA: Diagnosis not present

## 2022-05-17 DIAGNOSIS — M9903 Segmental and somatic dysfunction of lumbar region: Secondary | ICD-10-CM | POA: Diagnosis not present

## 2022-05-17 DIAGNOSIS — S338XXA Sprain of other parts of lumbar spine and pelvis, initial encounter: Secondary | ICD-10-CM | POA: Diagnosis not present

## 2022-05-17 DIAGNOSIS — M9902 Segmental and somatic dysfunction of thoracic region: Secondary | ICD-10-CM | POA: Diagnosis not present

## 2022-06-08 ENCOUNTER — Encounter (INDEPENDENT_AMBULATORY_CARE_PROVIDER_SITE_OTHER): Payer: Self-pay | Admitting: Gastroenterology

## 2022-06-14 DIAGNOSIS — M47812 Spondylosis without myelopathy or radiculopathy, cervical region: Secondary | ICD-10-CM | POA: Diagnosis not present

## 2022-06-14 DIAGNOSIS — S338XXA Sprain of other parts of lumbar spine and pelvis, initial encounter: Secondary | ICD-10-CM | POA: Diagnosis not present

## 2022-06-14 DIAGNOSIS — M531 Cervicobrachial syndrome: Secondary | ICD-10-CM | POA: Diagnosis not present

## 2022-06-14 DIAGNOSIS — M546 Pain in thoracic spine: Secondary | ICD-10-CM | POA: Diagnosis not present

## 2022-06-14 DIAGNOSIS — M9902 Segmental and somatic dysfunction of thoracic region: Secondary | ICD-10-CM | POA: Diagnosis not present

## 2022-06-14 DIAGNOSIS — M9901 Segmental and somatic dysfunction of cervical region: Secondary | ICD-10-CM | POA: Diagnosis not present

## 2022-06-14 DIAGNOSIS — M9903 Segmental and somatic dysfunction of lumbar region: Secondary | ICD-10-CM | POA: Diagnosis not present

## 2022-06-22 ENCOUNTER — Ambulatory Visit (INDEPENDENT_AMBULATORY_CARE_PROVIDER_SITE_OTHER): Payer: PPO | Admitting: Gastroenterology

## 2022-06-29 ENCOUNTER — Ambulatory Visit (INDEPENDENT_AMBULATORY_CARE_PROVIDER_SITE_OTHER): Payer: PPO | Admitting: Gastroenterology

## 2022-07-06 ENCOUNTER — Other Ambulatory Visit (HOSPITAL_COMMUNITY): Payer: Self-pay | Admitting: Internal Medicine

## 2022-07-06 ENCOUNTER — Ambulatory Visit (HOSPITAL_COMMUNITY)
Admission: RE | Admit: 2022-07-06 | Discharge: 2022-07-06 | Disposition: A | Discharge status: Home / Self Care | Payer: PPO | Source: Ambulatory Visit | Attending: Internal Medicine | Admitting: Internal Medicine

## 2022-07-06 DIAGNOSIS — J45901 Unspecified asthma with (acute) exacerbation: Secondary | ICD-10-CM | POA: Diagnosis not present

## 2022-07-06 DIAGNOSIS — R051 Acute cough: Secondary | ICD-10-CM | POA: Diagnosis not present

## 2022-07-06 DIAGNOSIS — R053 Chronic cough: Secondary | ICD-10-CM | POA: Diagnosis not present

## 2022-07-06 DIAGNOSIS — R059 Cough, unspecified: Secondary | ICD-10-CM | POA: Diagnosis not present

## 2022-07-06 DIAGNOSIS — J189 Pneumonia, unspecified organism: Secondary | ICD-10-CM | POA: Diagnosis not present

## 2022-07-06 DIAGNOSIS — Z0001 Encounter for general adult medical examination with abnormal findings: Secondary | ICD-10-CM | POA: Diagnosis not present

## 2022-07-06 DIAGNOSIS — Z6825 Body mass index (BMI) 25.0-25.9, adult: Secondary | ICD-10-CM | POA: Diagnosis not present

## 2022-07-06 DIAGNOSIS — E663 Overweight: Secondary | ICD-10-CM | POA: Diagnosis not present

## 2022-07-06 DIAGNOSIS — Z1331 Encounter for screening for depression: Secondary | ICD-10-CM | POA: Diagnosis not present

## 2022-07-07 DIAGNOSIS — Z0001 Encounter for general adult medical examination with abnormal findings: Secondary | ICD-10-CM | POA: Diagnosis not present

## 2022-07-07 DIAGNOSIS — D518 Other vitamin B12 deficiency anemias: Secondary | ICD-10-CM | POA: Diagnosis not present

## 2022-07-07 DIAGNOSIS — Z1331 Encounter for screening for depression: Secondary | ICD-10-CM | POA: Diagnosis not present

## 2022-07-07 DIAGNOSIS — E559 Vitamin D deficiency, unspecified: Secondary | ICD-10-CM | POA: Diagnosis not present

## 2022-07-07 DIAGNOSIS — E039 Hypothyroidism, unspecified: Secondary | ICD-10-CM | POA: Diagnosis not present

## 2022-07-12 DIAGNOSIS — M546 Pain in thoracic spine: Secondary | ICD-10-CM | POA: Diagnosis not present

## 2022-07-12 DIAGNOSIS — M531 Cervicobrachial syndrome: Secondary | ICD-10-CM | POA: Diagnosis not present

## 2022-07-12 DIAGNOSIS — M9903 Segmental and somatic dysfunction of lumbar region: Secondary | ICD-10-CM | POA: Diagnosis not present

## 2022-07-12 DIAGNOSIS — S338XXA Sprain of other parts of lumbar spine and pelvis, initial encounter: Secondary | ICD-10-CM | POA: Diagnosis not present

## 2022-07-12 DIAGNOSIS — M47812 Spondylosis without myelopathy or radiculopathy, cervical region: Secondary | ICD-10-CM | POA: Diagnosis not present

## 2022-07-12 DIAGNOSIS — M9901 Segmental and somatic dysfunction of cervical region: Secondary | ICD-10-CM | POA: Diagnosis not present

## 2022-07-12 DIAGNOSIS — M9902 Segmental and somatic dysfunction of thoracic region: Secondary | ICD-10-CM | POA: Diagnosis not present

## 2022-07-24 DIAGNOSIS — U071 COVID-19: Secondary | ICD-10-CM | POA: Diagnosis not present

## 2022-07-24 DIAGNOSIS — Z6825 Body mass index (BMI) 25.0-25.9, adult: Secondary | ICD-10-CM | POA: Diagnosis not present

## 2022-07-24 DIAGNOSIS — J45901 Unspecified asthma with (acute) exacerbation: Secondary | ICD-10-CM | POA: Diagnosis not present

## 2022-07-28 ENCOUNTER — Ambulatory Visit: Payer: PPO

## 2022-08-09 DIAGNOSIS — M47812 Spondylosis without myelopathy or radiculopathy, cervical region: Secondary | ICD-10-CM | POA: Diagnosis not present

## 2022-08-09 DIAGNOSIS — M531 Cervicobrachial syndrome: Secondary | ICD-10-CM | POA: Diagnosis not present

## 2022-08-09 DIAGNOSIS — M9902 Segmental and somatic dysfunction of thoracic region: Secondary | ICD-10-CM | POA: Diagnosis not present

## 2022-08-09 DIAGNOSIS — M9903 Segmental and somatic dysfunction of lumbar region: Secondary | ICD-10-CM | POA: Diagnosis not present

## 2022-08-09 DIAGNOSIS — S338XXA Sprain of other parts of lumbar spine and pelvis, initial encounter: Secondary | ICD-10-CM | POA: Diagnosis not present

## 2022-08-09 DIAGNOSIS — M9901 Segmental and somatic dysfunction of cervical region: Secondary | ICD-10-CM | POA: Diagnosis not present

## 2022-08-09 DIAGNOSIS — M546 Pain in thoracic spine: Secondary | ICD-10-CM | POA: Diagnosis not present

## 2022-08-21 ENCOUNTER — Telehealth (INDEPENDENT_AMBULATORY_CARE_PROVIDER_SITE_OTHER): Payer: Self-pay | Admitting: Gastroenterology

## 2022-08-21 ENCOUNTER — Ambulatory Visit (INDEPENDENT_AMBULATORY_CARE_PROVIDER_SITE_OTHER): Payer: PPO | Admitting: Gastroenterology

## 2022-08-21 ENCOUNTER — Encounter (INDEPENDENT_AMBULATORY_CARE_PROVIDER_SITE_OTHER): Payer: Self-pay | Admitting: Gastroenterology

## 2022-08-21 VITALS — BP 111/71 | HR 80 | Temp 97.5°F | Ht 67.0 in | Wt 156.8 lb

## 2022-08-21 DIAGNOSIS — K2100 Gastro-esophageal reflux disease with esophagitis, without bleeding: Secondary | ICD-10-CM

## 2022-08-21 NOTE — Patient Instructions (Signed)
Schedule EGD Schedule barium esophagram with pill Continue pantoprazole 40 mg qday

## 2022-08-21 NOTE — Progress Notes (Unsigned)
Annette Vance, M.D. Gastroenterology & Hepatology [facility] [address] [place], [place] [address]  Primary Care Physician: Sharilyn Sites, MD [address] [place] [id number]  I will communicate my assessment and recommendations to the referring MD via EMR.  Problems: GERD Schatzki's ring  History of Present Illness: Annette Vance is a 67 y.o. female with past medical history of GERD, history of Schatzki's ring, who presents for follow up of GERD and dysphagia.   The patient was last seen on 06/22/21. At that time, the patient was restarted on pantoprazole 40 mg qday and was scheduled for an EGD. EGD performed on 06/29/2021 - showed presence of Schatzki's ring dilated up to 18 mm, 1 cm hiatal hernia, normal stomach duodenum.  Patient reports feeling well and denies any dysphagia.  States that after her most recent procedure her symptoms have resolved. Takes pantoprazole 40 mg in AM compliantly- 1 hour before her breakfast. Denies having any heartburn or dysphagia, odynophagia. The patient denies having any nausea, vomiting, fever, chills, hematochezia, melena, hematemesis, abdominal distention, abdominal pain, diarrhea, jaundice, pruritus or weight loss.  The patient is interested in discuss the possibility of undergoing TIF as she is concerned about long-term effects of PPIs.  Last EGD: 12/2018  - Normal esophagus. - Moderate Schatzki ring. Dilated with a 15-16.5-18 mm balloon dilator was performed to 15 mm, 16.5 mm and 18 mm. The dilation site was examined and showed mild mucosal disruption, moderate improvement in luminal narrowing and no perforation. - LA Grade A reflux esophagitis. - 2 cm hiatal hernia. - Normal stomach. - Normal duodenal bulb and second portion of the duodenum.   Last Colonoscopy: 2016 Prep excellent. Redundant colon. Normal mucosa of cecum, ascending colon, hepatic flexure, transverse colon, splenic  flexure, descending and sigmoid colon. Normal rectal mucosa. Prominent hemorrhoids below the dentate line  Past Medical History: Past Medical History:  Diagnosis Date   Arthritis    Bradycardia    Chest discomfort    Dysphagia    GERD (gastroesophageal reflux disease)    Heart palpitations     Past Surgical History: Past Surgical History:  Procedure Laterality Date   BALLOON DILATION N/A 12/25/2013   Procedure: BALLOON DILATION;  Surgeon: Rogene Houston, MD;  [facility];  Service: Endoscopy;  Laterality: N/A;   BIOPSY  12/25/2013   Procedure: BIOPSY;  Surgeon: Rogene Houston, MD;  [facility];  Service: Endoscopy;;   CATARACT EXTRACTION Bilateral    one week apart. last week of september and first week of october   COLONOSCOPY N/A 07/29/2014   Procedure: COLONOSCOPY;  Surgeon: Rogene Houston, MD;  [facility];  Service: Endoscopy;  Laterality: N/A;  1025 - moved to 9:25 - Ann to notify pt   ESOPHAGEAL DILATION N/A 01/03/2019   Procedure: ESOPHAGEAL DILATION;  Surgeon: Rogene Houston, MD;  [facility];  Service: Endoscopy;  Laterality: N/A;   ESOPHAGEAL DILATION N/A 06/29/2021   Procedure: ESOPHAGEAL DILATION;  Surgeon: Harvel Quale, MD;  [facility];  Service: Gastroenterology;  Laterality: N/A;   ESOPHAGOGASTRODUODENOSCOPY N/A 12/25/2013   Procedure: ESOPHAGOGASTRODUODENOSCOPY (EGD);  Surgeon: Rogene Houston, MD;  [facility];  Service: Endoscopy;  Laterality: N/A;  1255   ESOPHAGOGASTRODUODENOSCOPY (EGD) WITH PROPOFOL N/A 01/03/2019   Procedure: ESOPHAGOGASTRODUODENOSCOPY (EGD) WITH PROPOFOL;  Surgeon: Rogene Houston, MD;  [facility];  Service: Endoscopy;  Laterality: N/A;  12:30  ESOPHAGOGASTRODUODENOSCOPY (EGD) WITH PROPOFOL N/A 06/29/2021   Procedure: ESOPHAGOGASTRODUODENOSCOPY (EGD) WITH PROPOFOL;  Surgeon: Harvel Quale, MD;  [facility];   Service: Gastroenterology;  Laterality: N/A;  9:00   HEMORRHOID SURGERY     Laser Surgery with Dr. Romona Curls   HEMORRHOID SURGERY N/A 07/10/2018   Procedure: EXTENSIVE HEMORRHOIDECTOMY;  Surgeon: Virl Cagey, MD;  [facility];  Service: General;  Laterality: N/A;   KNEE SURGERY Left    1996   SHOULDER ARTHROSCOPY Right 07/2018   WRIST SURGERY Left    dequavains    Family History: Family History  Problem Relation Age of Onset   Dementia Father    Stroke Paternal Grandfather    Hypertension Paternal Grandfather    Diabetes Brother     Social History: Social History   Tobacco Use  Smoking Status Never  Smokeless Tobacco Never   Social History   Substance and Sexual Activity  Alcohol Use Yes   Comment: occasional   Social History   Substance and Sexual Activity  Drug Use No    Allergies: No Known Allergies  Medications: Current Outpatient Medications  Medication Sig Dispense Refill   CALCIUM-VITAMIN D PO Take 1 tablet by mouth daily.     Coenzyme Q10 (CO Q 10 PO) Take 1 tablet by mouth daily.     Glycerin-Hypromellose-PEG 400 (DRY EYE RELIEF DROPS) 0.2-0.2-1 % SOLN Place 1 drop into both ears 2 (two) times daily.     Homeopathic Products (ARNICA EX) Apply 1 application topically 2 (two) times daily.     omega-3 acid ethyl esters (LOVAZA) 1 g capsule Take 2 g by mouth daily.     pantoprazole (PROTONIX) 40 MG tablet Take 1 tablet (40 mg total) by mouth daily. 90 tablet 3   Red Yeast Rice Extract (RED YEAST RICE PO) Take 2 tablets by mouth daily.     valACYclovir (VALTREX) 1000 MG tablet Take 1,000 mg by mouth 2 (two) times daily as needed (flair up).     No current facility-administered medications for this visit.    Review of Systems: GENERAL: negative for malaise, night sweats HEENT: No changes in hearing or vision, no nose bleeds or other nasal problems. NECK: Negative for lumps, goiter, pain and significant neck swelling RESPIRATORY: Negative for  cough, wheezing CARDIOVASCULAR: Negative for chest pain, leg swelling, palpitations, orthopnea GI: SEE HPI MUSCULOSKELETAL: Negative for joint pain or swelling, back pain, and muscle pain. SKIN: Negative for lesions, rash PSYCH: Negative for sleep disturbance, mood disorder and recent psychosocial stressors. HEMATOLOGY Negative for prolonged bleeding, bruising easily, and swollen nodes. ENDOCRINE: Negative for cold or heat intolerance, polyuria, polydipsia and goiter. NEURO: negative for tremor, gait imbalance, syncope and seizures. The remainder of the review of systems is noncontributory.   Physical Exam: BP 111/71 (BP Location: Left Arm, Patient Position: Sitting, Cuff Size: Normal)   Pulse 80   Temp (!) 97.5 F (36.4 C) (Temporal)   Ht '5\' 7"'$  (1.702 m)   Wt 156 lb 12.8 oz (71.1 kg)   BMI 24.56 kg/m  GENERAL: The patient is AO x3, in no acute distress. HEENT: Head is normocephalic and atraumatic. EOMI are intact. Mouth is well hydrated and without lesions. NECK: Supple. No masses LUNGS: Clear to auscultation. No presence of rhonchi/wheezing/rales. Adequate chest expansion HEART: RRR, normal s1 and s2. ABDOMEN: Soft, nontender, no guarding, no peritoneal signs, and nondistended. BS +. No masses. EXTREMITIES: Without any cyanosis, clubbing, rash, lesions or edema. NEUROLOGIC: AOx3, no focal  motor deficit. SKIN: no jaundice, no rashes  Imaging/Labs: as above  I personally reviewed and interpreted the available labs, imaging and endoscopic files.  Impression and Plan: Annette Vance is a 67 y.o. female with past medical history of GERD, history of Schatzki's ring, who presents for follow up of GERD and dysphagia.  The patient had previous dysphagia that has responded to dilation of Schatzki's ring.  She had clinical evidence of erosive reflux as she developed stricture at the GE junction due to this.  However, with PPI her symptoms have completely subsided.  She will benefit from  continuing PPI to avoid recurrence of stricturing disease.  The patient and I held a thorough discussion about potential nonpharmacologic treatments for reflux such as transoral Incisionless Fundoplication (TIF).  The details of the procedure, benefits and risks, as well as prognosis with this intervention was thoroughly discussed with the patient who understood and agreed.  Dietary modifications and post procedural recommendations were also discussed the patient.  The patient will read more about this procedure -she will proceed with this in the near future after April.  Will start the workup prior to evaluating the possibility of TIF versus cTIF with a repeat EGD and a barium esophagram.  - Schedule EGD - Schedule barium esophagram with pill - Continue pantoprazole 40 mg qday  All questions were answered.      Annette Peppers, MD Gastroenterology and Hepatology [facility]

## 2022-08-21 NOTE — Telephone Encounter (Signed)
Thanks for the update

## 2022-08-21 NOTE — Telephone Encounter (Signed)
Contacted pt to have EGD scheduled. Pt states she may put EGD off until the fall. Pt states she has so much going on at this time. Pt scheduled for esophagram on 08/25/22 at 10 AM arrive at 9:45 AM. Pt states she can not do that due to leaving for beach on Thursday. Gave pt central scheduling number to have esophogram rescheduled.

## 2022-08-22 ENCOUNTER — Ambulatory Visit (INDEPENDENT_AMBULATORY_CARE_PROVIDER_SITE_OTHER): Payer: Self-pay

## 2022-08-22 DIAGNOSIS — I8393 Asymptomatic varicose veins of bilateral lower extremities: Secondary | ICD-10-CM

## 2022-08-22 NOTE — Progress Notes (Signed)
Treated pt's BLE small reticular and spider veins with Asclera 1% administered with a 27g butterfly.  Patient received a total of 2 mL (20 mg). APP came in to assess pt prior to starting treatment. All questions answered. Pt tolerated well. Easy access. Pt was fitted for (medium) sized 20-30 mm Hg knee high compression stockings. Pt was given post treatment care instructions on handout and verbal. Will call if she has any questions/concerns.     Photos: Yes.    Compression stockings applied: Yes.

## 2022-08-23 NOTE — Telephone Encounter (Signed)
Pt left voicemail and states she is going to hold on any procedures and is going to cancel barium esophogram. Pt states she is going to hold of on all until later this year.

## 2022-08-25 ENCOUNTER — Other Ambulatory Visit (HOSPITAL_COMMUNITY): Payer: PPO

## 2022-09-04 ENCOUNTER — Other Ambulatory Visit (HOSPITAL_COMMUNITY): Payer: PPO

## 2022-09-04 DIAGNOSIS — D239 Other benign neoplasm of skin, unspecified: Secondary | ICD-10-CM | POA: Diagnosis not present

## 2022-09-04 DIAGNOSIS — Z85828 Personal history of other malignant neoplasm of skin: Secondary | ICD-10-CM | POA: Diagnosis not present

## 2022-09-04 DIAGNOSIS — L821 Other seborrheic keratosis: Secondary | ICD-10-CM | POA: Diagnosis not present

## 2022-09-13 DIAGNOSIS — M531 Cervicobrachial syndrome: Secondary | ICD-10-CM | POA: Diagnosis not present

## 2022-09-13 DIAGNOSIS — S338XXA Sprain of other parts of lumbar spine and pelvis, initial encounter: Secondary | ICD-10-CM | POA: Diagnosis not present

## 2022-09-13 DIAGNOSIS — M546 Pain in thoracic spine: Secondary | ICD-10-CM | POA: Diagnosis not present

## 2022-09-13 DIAGNOSIS — M9902 Segmental and somatic dysfunction of thoracic region: Secondary | ICD-10-CM | POA: Diagnosis not present

## 2022-09-13 DIAGNOSIS — M9901 Segmental and somatic dysfunction of cervical region: Secondary | ICD-10-CM | POA: Diagnosis not present

## 2022-09-13 DIAGNOSIS — M9903 Segmental and somatic dysfunction of lumbar region: Secondary | ICD-10-CM | POA: Diagnosis not present

## 2022-09-13 DIAGNOSIS — M47812 Spondylosis without myelopathy or radiculopathy, cervical region: Secondary | ICD-10-CM | POA: Diagnosis not present

## 2022-09-27 DIAGNOSIS — M2341 Loose body in knee, right knee: Secondary | ICD-10-CM | POA: Diagnosis not present

## 2022-09-27 DIAGNOSIS — M1711 Unilateral primary osteoarthritis, right knee: Secondary | ICD-10-CM | POA: Diagnosis not present

## 2022-10-16 DIAGNOSIS — M9903 Segmental and somatic dysfunction of lumbar region: Secondary | ICD-10-CM | POA: Diagnosis not present

## 2022-10-16 DIAGNOSIS — S338XXA Sprain of other parts of lumbar spine and pelvis, initial encounter: Secondary | ICD-10-CM | POA: Diagnosis not present

## 2022-10-16 DIAGNOSIS — M47812 Spondylosis without myelopathy or radiculopathy, cervical region: Secondary | ICD-10-CM | POA: Diagnosis not present

## 2022-10-16 DIAGNOSIS — M531 Cervicobrachial syndrome: Secondary | ICD-10-CM | POA: Diagnosis not present

## 2022-10-16 DIAGNOSIS — M9902 Segmental and somatic dysfunction of thoracic region: Secondary | ICD-10-CM | POA: Diagnosis not present

## 2022-10-16 DIAGNOSIS — M9901 Segmental and somatic dysfunction of cervical region: Secondary | ICD-10-CM | POA: Diagnosis not present

## 2022-10-16 DIAGNOSIS — M546 Pain in thoracic spine: Secondary | ICD-10-CM | POA: Diagnosis not present

## 2022-11-07 DIAGNOSIS — M25561 Pain in right knee: Secondary | ICD-10-CM | POA: Diagnosis not present

## 2022-11-07 DIAGNOSIS — M25511 Pain in right shoulder: Secondary | ICD-10-CM | POA: Diagnosis not present

## 2022-11-07 DIAGNOSIS — G8929 Other chronic pain: Secondary | ICD-10-CM | POA: Diagnosis not present

## 2022-11-07 DIAGNOSIS — M19011 Primary osteoarthritis, right shoulder: Secondary | ICD-10-CM | POA: Diagnosis not present

## 2022-11-15 DIAGNOSIS — M9903 Segmental and somatic dysfunction of lumbar region: Secondary | ICD-10-CM | POA: Diagnosis not present

## 2022-11-15 DIAGNOSIS — M531 Cervicobrachial syndrome: Secondary | ICD-10-CM | POA: Diagnosis not present

## 2022-11-15 DIAGNOSIS — M47812 Spondylosis without myelopathy or radiculopathy, cervical region: Secondary | ICD-10-CM | POA: Diagnosis not present

## 2022-11-15 DIAGNOSIS — M9902 Segmental and somatic dysfunction of thoracic region: Secondary | ICD-10-CM | POA: Diagnosis not present

## 2022-11-15 DIAGNOSIS — M546 Pain in thoracic spine: Secondary | ICD-10-CM | POA: Diagnosis not present

## 2022-11-15 DIAGNOSIS — S338XXA Sprain of other parts of lumbar spine and pelvis, initial encounter: Secondary | ICD-10-CM | POA: Diagnosis not present

## 2022-11-15 DIAGNOSIS — M9901 Segmental and somatic dysfunction of cervical region: Secondary | ICD-10-CM | POA: Diagnosis not present

## 2022-12-08 ENCOUNTER — Ambulatory Visit: Payer: PPO

## 2022-12-14 DIAGNOSIS — M531 Cervicobrachial syndrome: Secondary | ICD-10-CM | POA: Diagnosis not present

## 2022-12-14 DIAGNOSIS — S338XXA Sprain of other parts of lumbar spine and pelvis, initial encounter: Secondary | ICD-10-CM | POA: Diagnosis not present

## 2022-12-14 DIAGNOSIS — M546 Pain in thoracic spine: Secondary | ICD-10-CM | POA: Diagnosis not present

## 2022-12-14 DIAGNOSIS — M47812 Spondylosis without myelopathy or radiculopathy, cervical region: Secondary | ICD-10-CM | POA: Diagnosis not present

## 2022-12-14 DIAGNOSIS — M9902 Segmental and somatic dysfunction of thoracic region: Secondary | ICD-10-CM | POA: Diagnosis not present

## 2022-12-14 DIAGNOSIS — M9901 Segmental and somatic dysfunction of cervical region: Secondary | ICD-10-CM | POA: Diagnosis not present

## 2022-12-14 DIAGNOSIS — M9903 Segmental and somatic dysfunction of lumbar region: Secondary | ICD-10-CM | POA: Diagnosis not present

## 2022-12-18 DIAGNOSIS — Z1231 Encounter for screening mammogram for malignant neoplasm of breast: Secondary | ICD-10-CM | POA: Diagnosis not present

## 2023-01-01 DIAGNOSIS — M47812 Spondylosis without myelopathy or radiculopathy, cervical region: Secondary | ICD-10-CM | POA: Diagnosis not present

## 2023-01-01 DIAGNOSIS — M9901 Segmental and somatic dysfunction of cervical region: Secondary | ICD-10-CM | POA: Diagnosis not present

## 2023-01-01 DIAGNOSIS — M9902 Segmental and somatic dysfunction of thoracic region: Secondary | ICD-10-CM | POA: Diagnosis not present

## 2023-01-01 DIAGNOSIS — M531 Cervicobrachial syndrome: Secondary | ICD-10-CM | POA: Diagnosis not present

## 2023-01-01 DIAGNOSIS — S338XXA Sprain of other parts of lumbar spine and pelvis, initial encounter: Secondary | ICD-10-CM | POA: Diagnosis not present

## 2023-01-01 DIAGNOSIS — M546 Pain in thoracic spine: Secondary | ICD-10-CM | POA: Diagnosis not present

## 2023-01-01 DIAGNOSIS — M9903 Segmental and somatic dysfunction of lumbar region: Secondary | ICD-10-CM | POA: Diagnosis not present

## 2023-01-05 DIAGNOSIS — M25561 Pain in right knee: Secondary | ICD-10-CM | POA: Diagnosis not present

## 2023-01-05 DIAGNOSIS — R2241 Localized swelling, mass and lump, right lower limb: Secondary | ICD-10-CM | POA: Diagnosis not present

## 2023-01-18 DIAGNOSIS — M1711 Unilateral primary osteoarthritis, right knee: Secondary | ICD-10-CM | POA: Diagnosis not present

## 2023-01-18 DIAGNOSIS — R2241 Localized swelling, mass and lump, right lower limb: Secondary | ICD-10-CM | POA: Diagnosis not present

## 2023-01-18 DIAGNOSIS — M19011 Primary osteoarthritis, right shoulder: Secondary | ICD-10-CM | POA: Diagnosis not present

## 2023-01-18 DIAGNOSIS — M7121 Synovial cyst of popliteal space [Baker], right knee: Secondary | ICD-10-CM | POA: Diagnosis not present

## 2023-01-18 DIAGNOSIS — Z79899 Other long term (current) drug therapy: Secondary | ICD-10-CM | POA: Diagnosis not present

## 2023-01-18 DIAGNOSIS — S83231A Complex tear of medial meniscus, current injury, right knee, initial encounter: Secondary | ICD-10-CM | POA: Diagnosis not present

## 2023-01-18 DIAGNOSIS — K219 Gastro-esophageal reflux disease without esophagitis: Secondary | ICD-10-CM | POA: Diagnosis not present

## 2023-01-26 DIAGNOSIS — M25561 Pain in right knee: Secondary | ICD-10-CM | POA: Diagnosis not present

## 2023-01-26 DIAGNOSIS — M1711 Unilateral primary osteoarthritis, right knee: Secondary | ICD-10-CM | POA: Diagnosis not present

## 2023-01-26 DIAGNOSIS — M2341 Loose body in knee, right knee: Secondary | ICD-10-CM | POA: Diagnosis not present

## 2023-01-29 DIAGNOSIS — M47812 Spondylosis without myelopathy or radiculopathy, cervical region: Secondary | ICD-10-CM | POA: Diagnosis not present

## 2023-01-29 DIAGNOSIS — M9903 Segmental and somatic dysfunction of lumbar region: Secondary | ICD-10-CM | POA: Diagnosis not present

## 2023-01-29 DIAGNOSIS — M546 Pain in thoracic spine: Secondary | ICD-10-CM | POA: Diagnosis not present

## 2023-01-29 DIAGNOSIS — L570 Actinic keratosis: Secondary | ICD-10-CM | POA: Diagnosis not present

## 2023-01-29 DIAGNOSIS — M9902 Segmental and somatic dysfunction of thoracic region: Secondary | ICD-10-CM | POA: Diagnosis not present

## 2023-01-29 DIAGNOSIS — M531 Cervicobrachial syndrome: Secondary | ICD-10-CM | POA: Diagnosis not present

## 2023-01-29 DIAGNOSIS — M9901 Segmental and somatic dysfunction of cervical region: Secondary | ICD-10-CM | POA: Diagnosis not present

## 2023-01-29 DIAGNOSIS — S338XXA Sprain of other parts of lumbar spine and pelvis, initial encounter: Secondary | ICD-10-CM | POA: Diagnosis not present

## 2023-02-22 DIAGNOSIS — J302 Other seasonal allergic rhinitis: Secondary | ICD-10-CM | POA: Diagnosis not present

## 2023-02-22 DIAGNOSIS — E663 Overweight: Secondary | ICD-10-CM | POA: Diagnosis not present

## 2023-02-22 DIAGNOSIS — Z6825 Body mass index (BMI) 25.0-25.9, adult: Secondary | ICD-10-CM | POA: Diagnosis not present

## 2023-02-22 DIAGNOSIS — M25561 Pain in right knee: Secondary | ICD-10-CM | POA: Diagnosis not present

## 2023-02-22 DIAGNOSIS — J45901 Unspecified asthma with (acute) exacerbation: Secondary | ICD-10-CM | POA: Diagnosis not present

## 2023-02-23 DIAGNOSIS — M1711 Unilateral primary osteoarthritis, right knee: Secondary | ICD-10-CM | POA: Diagnosis not present

## 2023-02-23 DIAGNOSIS — M2341 Loose body in knee, right knee: Secondary | ICD-10-CM | POA: Diagnosis not present

## 2023-02-26 DIAGNOSIS — M9903 Segmental and somatic dysfunction of lumbar region: Secondary | ICD-10-CM | POA: Diagnosis not present

## 2023-02-26 DIAGNOSIS — M531 Cervicobrachial syndrome: Secondary | ICD-10-CM | POA: Diagnosis not present

## 2023-02-26 DIAGNOSIS — S338XXA Sprain of other parts of lumbar spine and pelvis, initial encounter: Secondary | ICD-10-CM | POA: Diagnosis not present

## 2023-02-26 DIAGNOSIS — M47812 Spondylosis without myelopathy or radiculopathy, cervical region: Secondary | ICD-10-CM | POA: Diagnosis not present

## 2023-02-26 DIAGNOSIS — M9902 Segmental and somatic dysfunction of thoracic region: Secondary | ICD-10-CM | POA: Diagnosis not present

## 2023-02-26 DIAGNOSIS — M9901 Segmental and somatic dysfunction of cervical region: Secondary | ICD-10-CM | POA: Diagnosis not present

## 2023-02-26 DIAGNOSIS — M546 Pain in thoracic spine: Secondary | ICD-10-CM | POA: Diagnosis not present

## 2023-03-05 ENCOUNTER — Other Ambulatory Visit (INDEPENDENT_AMBULATORY_CARE_PROVIDER_SITE_OTHER): Payer: Self-pay | Admitting: *Deleted

## 2023-03-05 DIAGNOSIS — K2100 Gastro-esophageal reflux disease with esophagitis, without bleeding: Secondary | ICD-10-CM

## 2023-03-05 DIAGNOSIS — R1319 Other dysphagia: Secondary | ICD-10-CM

## 2023-03-05 MED ORDER — PANTOPRAZOLE SODIUM 40 MG PO TBEC: 40.0000 mg | DELAYED_RELEASE_TABLET | Freq: Every day | ORAL | 1 refills | Status: DC

## 2023-03-06 DIAGNOSIS — M1711 Unilateral primary osteoarthritis, right knee: Secondary | ICD-10-CM | POA: Diagnosis not present

## 2023-03-06 DIAGNOSIS — M65861 Other synovitis and tenosynovitis, right lower leg: Secondary | ICD-10-CM | POA: Diagnosis not present

## 2023-03-06 DIAGNOSIS — M60261 Foreign body granuloma of soft tissue, not elsewhere classified, right lower leg: Secondary | ICD-10-CM | POA: Diagnosis not present

## 2023-03-06 DIAGNOSIS — K219 Gastro-esophageal reflux disease without esophagitis: Secondary | ICD-10-CM | POA: Diagnosis not present

## 2023-03-06 DIAGNOSIS — Z79899 Other long term (current) drug therapy: Secondary | ICD-10-CM | POA: Diagnosis not present

## 2023-03-06 DIAGNOSIS — M2341 Loose body in knee, right knee: Secondary | ICD-10-CM | POA: Diagnosis not present

## 2023-03-19 DIAGNOSIS — L570 Actinic keratosis: Secondary | ICD-10-CM | POA: Diagnosis not present

## 2023-03-19 DIAGNOSIS — Z85828 Personal history of other malignant neoplasm of skin: Secondary | ICD-10-CM | POA: Diagnosis not present

## 2023-03-19 DIAGNOSIS — D485 Neoplasm of uncertain behavior of skin: Secondary | ICD-10-CM | POA: Diagnosis not present

## 2023-03-26 DIAGNOSIS — M531 Cervicobrachial syndrome: Secondary | ICD-10-CM | POA: Diagnosis not present

## 2023-03-26 DIAGNOSIS — M47812 Spondylosis without myelopathy or radiculopathy, cervical region: Secondary | ICD-10-CM | POA: Diagnosis not present

## 2023-03-26 DIAGNOSIS — S338XXA Sprain of other parts of lumbar spine and pelvis, initial encounter: Secondary | ICD-10-CM | POA: Diagnosis not present

## 2023-03-26 DIAGNOSIS — M9901 Segmental and somatic dysfunction of cervical region: Secondary | ICD-10-CM | POA: Diagnosis not present

## 2023-03-26 DIAGNOSIS — M9903 Segmental and somatic dysfunction of lumbar region: Secondary | ICD-10-CM | POA: Diagnosis not present

## 2023-03-26 DIAGNOSIS — M546 Pain in thoracic spine: Secondary | ICD-10-CM | POA: Diagnosis not present

## 2023-03-26 DIAGNOSIS — M9902 Segmental and somatic dysfunction of thoracic region: Secondary | ICD-10-CM | POA: Diagnosis not present

## 2023-04-26 DIAGNOSIS — M9902 Segmental and somatic dysfunction of thoracic region: Secondary | ICD-10-CM | POA: Diagnosis not present

## 2023-04-26 DIAGNOSIS — M9903 Segmental and somatic dysfunction of lumbar region: Secondary | ICD-10-CM | POA: Diagnosis not present

## 2023-04-26 DIAGNOSIS — M546 Pain in thoracic spine: Secondary | ICD-10-CM | POA: Diagnosis not present

## 2023-04-26 DIAGNOSIS — M47812 Spondylosis without myelopathy or radiculopathy, cervical region: Secondary | ICD-10-CM | POA: Diagnosis not present

## 2023-04-26 DIAGNOSIS — M531 Cervicobrachial syndrome: Secondary | ICD-10-CM | POA: Diagnosis not present

## 2023-04-26 DIAGNOSIS — S338XXA Sprain of other parts of lumbar spine and pelvis, initial encounter: Secondary | ICD-10-CM | POA: Diagnosis not present

## 2023-04-26 DIAGNOSIS — M9901 Segmental and somatic dysfunction of cervical region: Secondary | ICD-10-CM | POA: Diagnosis not present

## 2023-05-21 DIAGNOSIS — M19011 Primary osteoarthritis, right shoulder: Secondary | ICD-10-CM | POA: Diagnosis not present

## 2023-05-21 DIAGNOSIS — M25611 Stiffness of right shoulder, not elsewhere classified: Secondary | ICD-10-CM | POA: Diagnosis not present

## 2023-05-22 DIAGNOSIS — L570 Actinic keratosis: Secondary | ICD-10-CM | POA: Diagnosis not present

## 2023-05-24 DIAGNOSIS — M9901 Segmental and somatic dysfunction of cervical region: Secondary | ICD-10-CM | POA: Diagnosis not present

## 2023-05-24 DIAGNOSIS — S338XXA Sprain of other parts of lumbar spine and pelvis, initial encounter: Secondary | ICD-10-CM | POA: Diagnosis not present

## 2023-05-24 DIAGNOSIS — M9902 Segmental and somatic dysfunction of thoracic region: Secondary | ICD-10-CM | POA: Diagnosis not present

## 2023-05-24 DIAGNOSIS — M47812 Spondylosis without myelopathy or radiculopathy, cervical region: Secondary | ICD-10-CM | POA: Diagnosis not present

## 2023-05-24 DIAGNOSIS — M531 Cervicobrachial syndrome: Secondary | ICD-10-CM | POA: Diagnosis not present

## 2023-05-24 DIAGNOSIS — M9903 Segmental and somatic dysfunction of lumbar region: Secondary | ICD-10-CM | POA: Diagnosis not present

## 2023-05-24 DIAGNOSIS — M546 Pain in thoracic spine: Secondary | ICD-10-CM | POA: Diagnosis not present

## 2023-05-28 DIAGNOSIS — M25511 Pain in right shoulder: Secondary | ICD-10-CM | POA: Diagnosis not present

## 2023-05-28 DIAGNOSIS — G8929 Other chronic pain: Secondary | ICD-10-CM | POA: Diagnosis not present

## 2023-05-28 DIAGNOSIS — M25611 Stiffness of right shoulder, not elsewhere classified: Secondary | ICD-10-CM | POA: Diagnosis not present

## 2023-06-21 DIAGNOSIS — M47812 Spondylosis without myelopathy or radiculopathy, cervical region: Secondary | ICD-10-CM | POA: Diagnosis not present

## 2023-06-21 DIAGNOSIS — M9902 Segmental and somatic dysfunction of thoracic region: Secondary | ICD-10-CM | POA: Diagnosis not present

## 2023-06-21 DIAGNOSIS — M9901 Segmental and somatic dysfunction of cervical region: Secondary | ICD-10-CM | POA: Diagnosis not present

## 2023-06-21 DIAGNOSIS — S338XXA Sprain of other parts of lumbar spine and pelvis, initial encounter: Secondary | ICD-10-CM | POA: Diagnosis not present

## 2023-06-21 DIAGNOSIS — M546 Pain in thoracic spine: Secondary | ICD-10-CM | POA: Diagnosis not present

## 2023-06-21 DIAGNOSIS — M531 Cervicobrachial syndrome: Secondary | ICD-10-CM | POA: Diagnosis not present

## 2023-06-21 DIAGNOSIS — M9903 Segmental and somatic dysfunction of lumbar region: Secondary | ICD-10-CM | POA: Diagnosis not present

## 2023-07-02 DIAGNOSIS — M25531 Pain in right wrist: Secondary | ICD-10-CM | POA: Diagnosis not present

## 2023-07-02 DIAGNOSIS — M654 Radial styloid tenosynovitis [de Quervain]: Secondary | ICD-10-CM | POA: Diagnosis not present

## 2023-07-02 DIAGNOSIS — M79644 Pain in right finger(s): Secondary | ICD-10-CM | POA: Diagnosis not present

## 2023-07-12 ENCOUNTER — Ambulatory Visit (INDEPENDENT_AMBULATORY_CARE_PROVIDER_SITE_OTHER): Payer: PPO | Admitting: Gastroenterology

## 2023-07-12 ENCOUNTER — Encounter (INDEPENDENT_AMBULATORY_CARE_PROVIDER_SITE_OTHER): Payer: Self-pay | Admitting: *Deleted

## 2023-07-12 ENCOUNTER — Encounter (INDEPENDENT_AMBULATORY_CARE_PROVIDER_SITE_OTHER): Payer: Self-pay | Admitting: Gastroenterology

## 2023-07-12 VITALS — BP 112/72 | HR 70 | Temp 97.1°F | Ht 67.0 in | Wt 158.3 lb

## 2023-07-12 DIAGNOSIS — K2100 Gastro-esophageal reflux disease with esophagitis, without bleeding: Secondary | ICD-10-CM

## 2023-07-12 DIAGNOSIS — K219 Gastro-esophageal reflux disease without esophagitis: Secondary | ICD-10-CM | POA: Diagnosis not present

## 2023-07-12 DIAGNOSIS — R1310 Dysphagia, unspecified: Secondary | ICD-10-CM

## 2023-07-12 DIAGNOSIS — R1319 Other dysphagia: Secondary | ICD-10-CM

## 2023-07-12 MED ORDER — PANTOPRAZOLE SODIUM 40 MG PO TBEC: 40.0000 mg | DELAYED_RELEASE_TABLET | Freq: Two times a day (BID) | ORAL | 3 refills | Status: DC

## 2023-07-12 NOTE — Patient Instructions (Signed)
Schedule EGD Increase pantoprazole 40 mg twice a day

## 2023-07-12 NOTE — Progress Notes (Signed)
Annette Vance, M.D. Gastroenterology & Hepatology [facility] [address] [place], [place] [address] Primary Care Physician: Assunta Found, MD [address] [place] [address]   Problems: Recurrent dysphagia GERD Schatzki's ring   History of Present Illness: Annette Vance is a 68 y.o. female with past medical history of GERD, history of Schatzki's ring, who presents for follow up of GERD and dysphagia.   Patient was last seen on 08/21/2022.  At that time the patient was scheduled for an EGD and barium esophagram.  She was continued on pantoprazole 40 mg every day.  Patient canceled her endoscopy and did not schedule esophagram as she change her mind regarding proceeding with evaluation for TIF.  Patient reports that she has noticed her dysphagia is sort of back, as food is taking longer to down. She reports that the food is not "getting stuck" but it is taking more time than usual - this has been going on for several months. She reports that she has been more conscious about this with foods like meat, she is trying to eat very slow. She is now taking Protonix 40 mg qday and waits at least 45 minutes to have breakfast. Has had very few episodes of heartburn when eating late, for which she has taken Tums, but she avoids eating late as much as possible.  The patient denies having any nausea, vomiting, fever, chills, hematochezia, melena, hematemesis, abdominal distention, abdominal pain, diarrhea, jaundice, pruritus or weight loss.  Last EGD performed on 06/29/2021 - showed presence of Schatzki's ring dilated up to 18 mm, 1 cm hiatal hernia, normal stomach duodenum.  Last Colonoscopy: 2016 Prep excellent. Redundant colon. Normal mucosa of cecum, ascending colon, hepatic flexure, transverse colon, splenic flexure, descending and sigmoid colon. Normal rectal mucosa. Prominent hemorrhoids below the dentate line  Past Medical  History: Past Medical History:  Diagnosis Date   Arthritis    Bradycardia    Chest discomfort    Dysphagia    GERD (gastroesophageal reflux disease)    Heart palpitations     Past Surgical History: Past Surgical History:  Procedure Laterality Date   BALLOON DILATION N/A 12/25/2013   Procedure: BALLOON DILATION;  Surgeon: Malissa Hippo, MD;  [facility];  Service: Endoscopy;  Laterality: N/A;   BIOPSY  12/25/2013   Procedure: BIOPSY;  Surgeon: Malissa Hippo, MD;  [facility];  Service: Endoscopy;;   CATARACT EXTRACTION Bilateral    one week apart. last week of september and first week of october   COLONOSCOPY N/A 07/29/2014   Procedure: COLONOSCOPY;  Surgeon: Malissa Hippo, MD;  [facility];  Service: Endoscopy;  Laterality: N/A;  1025 - moved to 9:25 - Ann to notify pt   ESOPHAGEAL DILATION N/A 01/03/2019   Procedure: ESOPHAGEAL DILATION;  Surgeon: Malissa Hippo, MD;  [facility];  Service: Endoscopy;  Laterality: N/A;   ESOPHAGEAL DILATION N/A 06/29/2021   Procedure: ESOPHAGEAL DILATION;  Surgeon: Dolores Frame, MD;  [facility];  Service: Gastroenterology;  Laterality: N/A;   ESOPHAGOGASTRODUODENOSCOPY N/A 12/25/2013   Procedure: ESOPHAGOGASTRODUODENOSCOPY (EGD);  Surgeon: Malissa Hippo, MD;  [facility];  Service: Endoscopy;  Laterality: N/A;  1255   ESOPHAGOGASTRODUODENOSCOPY (EGD) WITH PROPOFOL N/A 01/03/2019   Procedure: ESOPHAGOGASTRODUODENOSCOPY (EGD) WITH PROPOFOL;  Surgeon: Malissa Hippo, MD;  [facility];  Service: Endoscopy;  Laterality: N/A;  12:30   ESOPHAGOGASTRODUODENOSCOPY (EGD) WITH PROPOFOL  N/A 06/29/2021   Procedure: ESOPHAGOGASTRODUODENOSCOPY (EGD) WITH PROPOFOL;  Surgeon: Dolores Frame, MD;  [facility];  Service: Gastroenterology;  Laterality: N/A;  9:00   HEMORRHOID SURGERY     Laser Surgery with Dr. Malvin Johns   HEMORRHOID SURGERY  N/A 07/10/2018   Procedure: EXTENSIVE HEMORRHOIDECTOMY;  Surgeon: Lucretia Roers, MD;  [facility];  Service: General;  Laterality: N/A;   KNEE SURGERY Left    1996   SHOULDER ARTHROSCOPY Right 07/2018   WRIST SURGERY Left    dequavains    Family History: Family History  Problem Relation Age of Onset   Dementia Father    Stroke Paternal Grandfather    Hypertension Paternal Grandfather    Diabetes Brother     Social History: Social History   Tobacco Use  Smoking Status Never  Smokeless Tobacco Never   Social History   Substance and Sexual Activity  Alcohol Use Yes   Comment: occasional   Social History   Substance and Sexual Activity  Drug Use No    Allergies: No Known Allergies  Medications: Current Outpatient Medications  Medication Sig Dispense Refill   CALCIUM-VITAMIN D PO Take 1 tablet by mouth daily.     Coenzyme Q10 (CO Q 10 PO) Take 1 tablet by mouth daily.     Glycerin-Hypromellose-PEG 400 (DRY EYE RELIEF DROPS) 0.2-0.2-1 % SOLN Place 1 drop into both ears 2 (two) times daily.     Homeopathic Products (ARNICA EX) Apply 1 application topically 2 (two) times daily.     omega-3 acid ethyl esters (LOVAZA) 1 g capsule Take 2 g by mouth daily.     pantoprazole (PROTONIX) 40 MG tablet Take 1 tablet (40 mg total) by mouth daily. 90 tablet 1   Red Yeast Rice Extract (RED YEAST RICE PO) Take 2 tablets by mouth daily.     valACYclovir (VALTREX) 1000 MG tablet Take 1,000 mg by mouth 2 (two) times daily as needed (flair up).     No current facility-administered medications for this visit.    Review of Systems: GENERAL: negative for malaise, night sweats HEENT: No changes in hearing or vision, no nose bleeds or other nasal problems. NECK: Negative for lumps, goiter, pain and significant neck swelling RESPIRATORY: Negative for cough, wheezing CARDIOVASCULAR: Negative for chest pain, leg swelling, palpitations, orthopnea GI: SEE HPI MUSCULOSKELETAL:  Negative for joint pain or swelling, back pain, and muscle pain. SKIN: Negative for lesions, rash PSYCH: Negative for sleep disturbance, mood disorder and recent psychosocial stressors. HEMATOLOGY Negative for prolonged bleeding, bruising easily, and swollen nodes. ENDOCRINE: Negative for cold or heat intolerance, polyuria, polydipsia and goiter. NEURO: negative for tremor, gait imbalance, syncope and seizures. The remainder of the review of systems is noncontributory.   Physical Exam: BP 112/72 (BP Location: Left Arm, Patient Position: Sitting, Cuff Size: Normal)   Pulse 70   Temp (!) 97.1 F (36.2 C) (Temporal)   Ht 5\' 7"  (1.702 m)   Wt 158 lb 4.8 oz (71.8 kg)   BMI 24.79 kg/m  GENERAL: The patient is AO x3, in no acute distress. HEENT: Head is normocephalic and atraumatic. EOMI are intact. Mouth is well hydrated and without lesions. NECK: Supple. No masses LUNGS: Clear to auscultation. No presence of rhonchi/wheezing/rales. Adequate chest expansion HEART: RRR, normal s1 and s2. ABDOMEN: Soft, nontender, no guarding, no peritoneal signs, and nondistended. BS +. No masses. EXTREMITIES: Without any cyanosis, clubbing, rash, lesions or edema. NEUROLOGIC: AOx3, no focal motor deficit. SKIN: no  jaundice, no rashes   Imaging/Labs: as above  I personally reviewed and interpreted the available labs, imaging and endoscopic files.  Impression and Plan: LEATHA BUCKMASTER is a 68 y.o. female with past medical history of GERD, history of Schatzki's ring, who presents for follow up of GERD and dysphagia.  Patient has presented recurrent episodes of dysphagia.  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  We discussed the possibility of increasing the dose of pantoprazole for now but we will need to evaluate her upper gastrointestinal system with an EGD.  She is agreeable to proceed with  this.  -Schedule EGD -Increase pantoprazole 40 mg twice a day  All questions were answered.      Annette Blazing, MD Gastroenterology and Hepatology [facility]

## 2023-07-12 NOTE — H&P (View-Only) (Signed)
Katrinka Blazing, M.D. Gastroenterology & Hepatology [facility] Gastroenterology [address] [place], [place] [address] Primary Care Physician: Assunta Found, MD [address] [place] [address]   Problems: Recurrent dysphagia GERD Schatzki's ring   History of Present Illness: Annette Vance is a 68 y.o. female with past medical history of GERD, history of Schatzki's ring, who presents for follow up of GERD and dysphagia.   Patient was last seen on 08/21/2022.  At that time the patient was scheduled for an EGD and barium esophagram.  She was continued on pantoprazole 40 mg every day.  Patient canceled her endoscopy and did not schedule esophagram as she change her mind regarding proceeding with evaluation for TIF.  Patient reports that she has noticed her dysphagia is sort of back, as food is taking longer to down. She reports that the food is not "getting stuck" but it is taking more time than usual - this has been going on for several months. She reports that she has been more conscious about this with foods like meat, she is trying to eat very slow. She is now taking Protonix 40 mg qday and waits at least 45 minutes to have breakfast. Has had very few episodes of heartburn when eating late, for which she has taken Tums, but she avoids eating late as much as possible.  The patient denies having any nausea, vomiting, fever, chills, hematochezia, melena, hematemesis, abdominal distention, abdominal pain, diarrhea, jaundice, pruritus or weight loss.  Last EGD performed on 06/29/2021 - showed presence of Schatzki's ring dilated up to 18 mm, 1 cm hiatal hernia, normal stomach duodenum.  Last Colonoscopy: 2016 Prep excellent. Redundant colon. Normal mucosa of cecum, ascending colon, hepatic flexure, transverse colon, splenic flexure, descending and sigmoid colon. Normal rectal mucosa. Prominent hemorrhoids below the dentate line  Past Medical  History: Past Medical History:  Diagnosis Date   Arthritis    Bradycardia    Chest discomfort    Dysphagia    GERD (gastroesophageal reflux disease)    Heart palpitations     Past Surgical History: Past Surgical History:  Procedure Laterality Date   BALLOON DILATION N/A 12/25/2013   Procedure: BALLOON DILATION;  Surgeon: Malissa Hippo, MD;  [facility];  Service: Endoscopy;  Laterality: N/A;   BIOPSY  12/25/2013   Procedure: BIOPSY;  Surgeon: Malissa Hippo, MD;  [facility]: [facility];  Service: Endoscopy;;   CATARACT EXTRACTION Bilateral    one week apart. last week of september and first week of october   COLONOSCOPY N/A 07/29/2014   Procedure: COLONOSCOPY;  Surgeon: Malissa Hippo, MD;  [facility]: [facility];  Service: Endoscopy;  Laterality: N/A;  1025 - moved to 9:25 - Ann to notify pt   ESOPHAGEAL DILATION N/A 01/03/2019   Procedure: ESOPHAGEAL DILATION;  Surgeon: Malissa Hippo, MD;  [facility];  Service: Endoscopy;  Laterality: N/A;   ESOPHAGEAL DILATION N/A 06/29/2021   Procedure: ESOPHAGEAL DILATION;  Surgeon: Dolores Frame, MD;  [facility]: [facility];  Service: Gastroenterology;  Laterality: N/A;   ESOPHAGOGASTRODUODENOSCOPY N/A 12/25/2013   Procedure: ESOPHAGOGASTRODUODENOSCOPY (EGD);  Surgeon: Malissa Hippo, MD;  [facility];  Service: Endoscopy;  Laterality: N/A;  1255   ESOPHAGOGASTRODUODENOSCOPY (EGD) WITH PROPOFOL N/A 01/03/2019   Procedure: ESOPHAGOGASTRODUODENOSCOPY (EGD) WITH PROPOFOL;  Surgeon: Malissa Hippo, MD;  [facility];  Service: Endoscopy;  Laterality: N/A;  12:30   ESOPHAGOGASTRODUODENOSCOPY (EGD) WITH PROPOFOL  N/A 06/29/2021   Procedure: ESOPHAGOGASTRODUODENOSCOPY (EGD) WITH PROPOFOL;  Surgeon: Dolores Frame, MD;  [facility];  Service: Gastroenterology;  Laterality: N/A;  9:00   HEMORRHOID SURGERY     Laser Surgery with Dr. Malvin Johns   HEMORRHOID SURGERY  N/A 07/10/2018   Procedure: EXTENSIVE HEMORRHOIDECTOMY;  Surgeon: Lucretia Roers, MD;  Location: [facility];  Service: General;  Laterality: N/A;   KNEE SURGERY Left    1996   SHOULDER ARTHROSCOPY Right 07/2018   WRIST SURGERY Left    dequavains    Family History: Family History  Problem Relation Age of Onset   Dementia Father    Stroke Paternal Grandfather    Hypertension Paternal Grandfather    Diabetes Brother     Social History: Social History   Tobacco Use  Smoking Status Never  Smokeless Tobacco Never   Social History   Substance and Sexual Activity  Alcohol Use Yes   Comment: occasional   Social History   Substance and Sexual Activity  Drug Use No    Allergies: No Known Allergies  Medications: Current Outpatient Medications  Medication Sig Dispense Refill   CALCIUM-VITAMIN D PO Take 1 tablet by mouth daily.     Coenzyme Q10 (CO Q 10 PO) Take 1 tablet by mouth daily.     Glycerin-Hypromellose-PEG 400 (DRY EYE RELIEF DROPS) 0.2-0.2-1 % SOLN Place 1 drop into both ears 2 (two) times daily.     Homeopathic Products (ARNICA EX) Apply 1 application topically 2 (two) times daily.     omega-3 acid ethyl esters (LOVAZA) 1 g capsule Take 2 g by mouth daily.     pantoprazole (PROTONIX) 40 MG tablet Take 1 tablet (40 mg total) by mouth daily. 90 tablet 1   Red Yeast Rice Extract (RED YEAST RICE PO) Take 2 tablets by mouth daily.     valACYclovir (VALTREX) 1000 MG tablet Take 1,000 mg by mouth 2 (two) times daily as needed (flair up).     No current facility-administered medications for this visit.    Review of Systems: GENERAL: negative for malaise, night sweats HEENT: No changes in hearing or vision, no nose bleeds or other nasal problems. NECK: Negative for lumps, goiter, pain and significant neck swelling RESPIRATORY: Negative for cough, wheezing CARDIOVASCULAR: Negative for chest pain, leg swelling, palpitations, orthopnea GI: SEE HPI MUSCULOSKELETAL:  Negative for joint pain or swelling, back pain, and muscle pain. SKIN: Negative for lesions, rash PSYCH: Negative for sleep disturbance, mood disorder and recent psychosocial stressors. HEMATOLOGY Negative for prolonged bleeding, bruising easily, and swollen nodes. ENDOCRINE: Negative for cold or heat intolerance, polyuria, polydipsia and goiter. NEURO: negative for tremor, gait imbalance, syncope and seizures. The remainder of the review of systems is noncontributory.   Physical Exam: BP 112/72 (BP Location: Left Arm, Patient Position: Sitting, Cuff Size: Normal)   Pulse 70   Temp (!) 97.1 F (36.2 C) (Temporal)   Ht 5\' 7"  (1.702 m)   Wt 158 lb 4.8 oz (71.8 kg)   BMI 24.79 kg/m  GENERAL: The patient is AO x3, in no acute distress. HEENT: Head is normocephalic and atraumatic. EOMI are intact. Mouth is well hydrated and without lesions. NECK: Supple. No masses LUNGS: Clear to auscultation. No presence of rhonchi/wheezing/rales. Adequate chest expansion HEART: RRR, normal s1 and s2. ABDOMEN: Soft, nontender, no guarding, no peritoneal signs, and nondistended. BS +. No masses. EXTREMITIES: Without any cyanosis, clubbing, rash, lesions or edema. NEUROLOGIC: AOx3, no focal motor deficit. SKIN: no  jaundice, no rashes   Imaging/Labs: as above  I personally reviewed and interpreted the available labs, imaging and endoscopic files.  Impression and Plan: Annette Vance is a 68 y.o. female with past medical history of GERD, history of Schatzki's ring, who presents for follow up of GERD and dysphagia.  Patient has presented recurrent episodes of dysphagia.  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  We discussed the possibility of increasing the dose of pantoprazole for now but we will need to evaluate her upper gastrointestinal system with an EGD.  She is agreeable to proceed with  this.  -Schedule EGD -Increase pantoprazole 40 mg twice a day  All questions were answered.      Katrinka Blazing, MD Gastroenterology and Hepatology [facility] Gastroenterology

## 2023-07-19 DIAGNOSIS — M9901 Segmental and somatic dysfunction of cervical region: Secondary | ICD-10-CM | POA: Diagnosis not present

## 2023-07-19 DIAGNOSIS — M531 Cervicobrachial syndrome: Secondary | ICD-10-CM | POA: Diagnosis not present

## 2023-07-19 DIAGNOSIS — M47812 Spondylosis without myelopathy or radiculopathy, cervical region: Secondary | ICD-10-CM | POA: Diagnosis not present

## 2023-07-19 DIAGNOSIS — M9902 Segmental and somatic dysfunction of thoracic region: Secondary | ICD-10-CM | POA: Diagnosis not present

## 2023-07-19 DIAGNOSIS — M546 Pain in thoracic spine: Secondary | ICD-10-CM | POA: Diagnosis not present

## 2023-07-19 DIAGNOSIS — M9903 Segmental and somatic dysfunction of lumbar region: Secondary | ICD-10-CM | POA: Diagnosis not present

## 2023-07-19 DIAGNOSIS — S338XXA Sprain of other parts of lumbar spine and pelvis, initial encounter: Secondary | ICD-10-CM | POA: Diagnosis not present

## 2023-07-27 ENCOUNTER — Encounter (HOSPITAL_COMMUNITY)
Admission: RE | Admit: 2023-07-27 | Discharge: 2023-07-27 | Disposition: A | Discharge status: Home / Self Care | Payer: PPO | Source: Ambulatory Visit | Attending: Gastroenterology | Admitting: Gastroenterology

## 2023-07-31 ENCOUNTER — Encounter (HOSPITAL_COMMUNITY): Payer: Self-pay | Admitting: Gastroenterology

## 2023-07-31 ENCOUNTER — Ambulatory Visit (HOSPITAL_COMMUNITY): Payer: PPO | Admitting: Anesthesiology

## 2023-07-31 ENCOUNTER — Other Ambulatory Visit: Payer: Self-pay

## 2023-07-31 ENCOUNTER — Ambulatory Visit (HOSPITAL_BASED_OUTPATIENT_CLINIC_OR_DEPARTMENT_OTHER): Payer: PPO | Admitting: Anesthesiology

## 2023-07-31 ENCOUNTER — Encounter (HOSPITAL_COMMUNITY)
Admission: RE | Disposition: A | Discharge status: Home / Self Care | Payer: Self-pay | Source: Ambulatory Visit | Attending: Gastroenterology

## 2023-07-31 ENCOUNTER — Ambulatory Visit (HOSPITAL_COMMUNITY)
Admission: RE | Admit: 2023-07-31 | Discharge: 2023-07-31 | Disposition: A | Discharge status: Home / Self Care | Payer: PPO | Source: Ambulatory Visit | Attending: Gastroenterology | Admitting: Gastroenterology

## 2023-07-31 DIAGNOSIS — Z79899 Other long term (current) drug therapy: Secondary | ICD-10-CM | POA: Insufficient documentation

## 2023-07-31 DIAGNOSIS — K449 Diaphragmatic hernia without obstruction or gangrene: Secondary | ICD-10-CM | POA: Diagnosis not present

## 2023-07-31 DIAGNOSIS — K219 Gastro-esophageal reflux disease without esophagitis: Secondary | ICD-10-CM | POA: Diagnosis not present

## 2023-07-31 DIAGNOSIS — R1310 Dysphagia, unspecified: Secondary | ICD-10-CM | POA: Insufficient documentation

## 2023-07-31 HISTORY — PX: BIOPSY: SHX5522

## 2023-07-31 HISTORY — PX: ESOPHAGOGASTRODUODENOSCOPY (EGD) WITH PROPOFOL: SHX5813

## 2023-07-31 SURGERY — ESOPHAGOGASTRODUODENOSCOPY (EGD) WITH PROPOFOL
Anesthesia: General

## 2023-07-31 MED ORDER — LACTATED RINGERS IV SOLN: INTRAVENOUS | Status: DC | PRN

## 2023-07-31 MED ORDER — PHENYLEPHRINE 80 MCG/ML (10ML) SYRINGE FOR IV PUSH (FOR BLOOD PRESSURE SUPPORT)
PREFILLED_SYRINGE | INTRAVENOUS | Status: AC
  Filled 2023-07-31: qty 10

## 2023-07-31 MED ORDER — PHENYLEPHRINE 80 MCG/ML (10ML) SYRINGE FOR IV PUSH (FOR BLOOD PRESSURE SUPPORT)
PREFILLED_SYRINGE | INTRAVENOUS | Status: DC | PRN
  Administered 2023-07-31: 160 ug via INTRAVENOUS

## 2023-07-31 MED ORDER — PROPOFOL 500 MG/50ML IV EMUL
INTRAVENOUS | Status: DC | PRN
  Administered 2023-07-31: 150 ug/kg/min via INTRAVENOUS

## 2023-07-31 MED ORDER — LIDOCAINE HCL (PF) 2 % IJ SOLN
INTRAMUSCULAR | Status: DC | PRN
  Administered 2023-07-31: 100 mg via INTRADERMAL

## 2023-07-31 MED ORDER — PROPOFOL 10 MG/ML IV BOLUS
INTRAVENOUS | Status: DC | PRN
  Administered 2023-07-31: 50 mg via INTRAVENOUS
  Administered 2023-07-31: 100 mg via INTRAVENOUS
  Administered 2023-07-31: 50 mg via INTRAVENOUS

## 2023-07-31 NOTE — Op Note (Signed)
[facility] Patient Name: Annette Vance Procedure Date: 07/31/2023 10:52 AM MRN: [id number] Date of Birth: 12-17-1955 Attending MD: Katrinka Blazing , , [id number] CSN: [id number] Age: 68 Admit Type: Outpatient Procedure:                Upper GI endoscopy Indications:              Dysphagia Providers:                Katrinka Blazing, Nena Polio, RN, Kristine L. Jessee Avers, Technician Referring MD:              Medicines:                Monitored Anesthesia Care Complications:            No immediate complications. Estimated Blood Loss:     Estimated blood loss: none. Procedure:                Pre-Anesthesia Assessment:                           - Prior to the procedure, a History and Physical                            was performed, and patient medications, allergies                            and sensitivities were reviewed. The patient's                            tolerance of previous anesthesia was reviewed.                           - The risks and benefits of the procedure and the                            sedation options and risks were discussed with the                            patient. All questions were answered and informed                            consent was obtained.                           - ASA Grade Assessment: II - A patient with mild                            systemic disease.                           After obtaining informed consent, the endoscope was                            passed under direct vision. Throughout the  procedure, the patient's blood pressure, pulse, and                            oxygen saturations were monitored continuously. The                            [id number]-H190 ([id number]) scope was introduced through the                            mouth, and advanced to the second part of duodenum.                            The upper GI endoscopy was accomplished without                             difficulty. The patient tolerated the procedure                            well. Scope In: 11:02:16 AM Scope Out: 11:14:19 AM Total Procedure Duration: 0 hours 12 minutes 3 seconds  Findings:      No endoscopic abnormality was evident in the esophagus to explain the       patient's complaint of dysphagia. It was decided, however, to proceed       with dilation of the entire esophagus. A guidewire was placed and the       scope was withdrawn. Dilation was performed with a Savary dilator with       mild resistance at 18 mm. The dilation site was examined following       endoscope reinsertion and showed mild mucosal disruption. Biopsies were       obtained from the proximal and distal esophagus with cold forceps for       histology of suspected eosinophilic esophagitis.      The gastroesophageal flap valve was visualized endoscopically and       classified as Hill Grade III (minimal fold, loose to endoscope, hiatal       hernia likely).      A 2 cm hiatal hernia was present.      The entire examined stomach was normal.      The examined duodenum was normal. Impression:               - No endoscopic esophageal abnormality to explain                            patient's dysphagia. Esophagus dilated. Dilated.                           - 2 cm hiatal hernia.                           - Normal stomach.                           - Normal examined duodenum.                           - Biopsies were taken with a cold  forceps for                            evaluation of eosinophilic esophagitis. Moderate Sedation:      Per Anesthesia Care Recommendation:           - Discharge patient to home (ambulatory).                           - Resume previous diet.                           - Continue present medications. Procedure Code(s):        --- Professional ---                           [phone], Esophagogastroduodenoscopy, flexible,                            transoral; with insertion of  guide wire followed by                            passage of dilator(s) through esophagus over guide                            wire                           [id number], 59, Esophagogastroduodenoscopy, flexible,                            transoral; with biopsy, single or multiple Diagnosis Code(s):        --- Professional ---                           R13.10, Dysphagia, unspecified                           K44.9, Diaphragmatic hernia without obstruction or                            gangrene CPT copyright 2022 American Medical Association. All rights reserved. The codes documented in this report are preliminary and upon coder review may  be revised to meet current compliance requirements. Katrinka Blazing, MD Katrinka Blazing,  07/31/2023 11:18:14 AM This report has been signed electronically. Number of Addenda: 0

## 2023-07-31 NOTE — Anesthesia Preprocedure Evaluation (Signed)
Anesthesia Evaluation  Patient identified by MRN, date of birth, ID band Patient awake    Reviewed: Allergy & Precautions, H&P , NPO status , Patient's Chart, lab work & pertinent test results, reviewed documented beta blocker date and time   Airway Mallampati: II  TM Distance: >3 FB Neck ROM: full    Dental no notable dental hx. (+) Teeth Intact   Pulmonary neg pulmonary ROS   Pulmonary exam normal breath sounds clear to auscultation       Cardiovascular Exercise Tolerance: Good negative cardio ROS Normal cardiovascular exam Rhythm:regular Rate:Normal     Neuro/Psych negative neurological ROS  negative psych ROS   GI/Hepatic Neg liver ROS, hiatal hernia,GERD  ,,  Endo/Other  negative endocrine ROS    Renal/GU negative Renal ROS  negative genitourinary   Musculoskeletal   Abdominal   Peds  Hematology negative hematology ROS (+)   Anesthesia Other Findings   Reproductive/Obstetrics negative OB ROS                             Anesthesia Physical Anesthesia Plan  ASA: 2  Anesthesia Plan: General   Post-op Pain Management: Minimal or no pain anticipated   Induction: Intravenous  PONV Risk Score and Plan: 1  Airway Management Planned: Simple Face Mask  Additional Equipment:   Intra-op Plan:   Post-operative Plan:   Informed Consent: I have reviewed the patients History and Physical, chart, labs and discussed the procedure including the risks, benefits and alternatives for the proposed anesthesia with the patient or authorized representative who has indicated his/her understanding and acceptance.     Dental Advisory Given  Plan Discussed with: CRNA  Anesthesia Plan Comments:        Anesthesia Quick Evaluation

## 2023-07-31 NOTE — Interval H&P Note (Signed)
History and Physical Interval Note:  07/31/2023 9:51 AM  Annette Vance  has presented today for surgery, with the diagnosis of dysphagia.  The various methods of treatment have been discussed with the patient and family. After consideration of risks, benefits and other options for treatment, the patient has consented to  Procedure(s) with comments: ESOPHAGOGASTRODUODENOSCOPY (EGD) WITH PROPOFOL (N/A) - 11:00 am, asa 1 as a surgical intervention.  The patient's history has been reviewed, patient examined, no change in status, stable for surgery.  I have reviewed the patient's chart and labs.  Questions were answered to the patient's satisfaction.     Katrinka Blazing Mayorga

## 2023-07-31 NOTE — Discharge Instructions (Addendum)
You are being discharged to home.  Resume your previous diet.  Continue your present medications.  We are waiting for your pathology results.

## 2023-07-31 NOTE — Anesthesia Procedure Notes (Signed)
Date/Time: 07/31/2023 10:57 AM  Performed by: Julian Reil, CRNAPre-anesthesia Checklist: Patient identified, Emergency Drugs available, Suction available and Patient being monitored Patient Re-evaluated:Patient Re-evaluated prior to induction Oxygen Delivery Method: Nasal cannula Induction Type: IV induction Placement Confirmation: positive ETCO2 Comments: Optiflow High Flow [place] O2 used.

## 2023-07-31 NOTE — Transfer of Care (Signed)
Immediate Anesthesia Transfer of Care Note  Patient: Annette Vance  Procedure(s) Performed: ESOPHAGOGASTRODUODENOSCOPY (EGD) WITH PROPOFOL BIOPSY  Patient Location: Short Stay  Anesthesia Type:General  Level of Consciousness: drowsy  Airway & Oxygen Therapy: Patient Spontanous Breathing  Post-op Assessment: Report given to RN and Post -op Vital signs reviewed and stable  Post vital signs: Reviewed and stable  Last Vitals:  Vitals Value Taken Time  BP    Temp    Pulse    Resp    SpO2      Last Pain:  Vitals:   07/31/23 1010  TempSrc: Oral  PainSc: 0-No pain         Complications: No notable events documented.

## 2023-07-31 NOTE — Anesthesia Postprocedure Evaluation (Signed)
Anesthesia Post Note  Patient: Annette Vance  Procedure(s) Performed: ESOPHAGOGASTRODUODENOSCOPY (EGD) WITH PROPOFOL BIOPSY  Patient location during evaluation: PACU Anesthesia Type: General Level of consciousness: awake and alert Pain management: pain level controlled Vital Signs Assessment: post-procedure vital signs reviewed and stable Respiratory status: spontaneous breathing, nonlabored ventilation and respiratory function stable Cardiovascular status: blood pressure returned to baseline and stable Postop Assessment: no apparent nausea or vomiting Anesthetic complications: no   No notable events documented.   Last Vitals:  Vitals:   07/31/23 1119 07/31/23 1122  BP:  100/63  Pulse: (!) 55   Resp: 17   Temp: 36.8 C   SpO2: 95%     Last Pain:  Vitals:   07/31/23 1119  TempSrc: Axillary  PainSc: 0-No pain                 Roslynn Amble

## 2023-08-01 ENCOUNTER — Encounter (HOSPITAL_COMMUNITY): Payer: Self-pay | Admitting: Gastroenterology

## 2023-08-01 ENCOUNTER — Ambulatory Visit: Payer: PPO

## 2023-08-01 LAB — SURGICAL PATHOLOGY

## 2023-08-01 NOTE — Progress Notes (Signed)
Pt came in for sclerotherapy. Her BLE reticular and spider veins that were treated previously last March much improved. She is happy with results. She had some anterior shin area staining on LLE. She feels this has been slowly improving but thought we could treat the area with sclerosant. I explained this is staining and should continue to fade. Advised her to wear sunscreen over the area. Pt verbalized understanding of the above; no further questions/concerns at this time.

## 2023-08-02 ENCOUNTER — Encounter (INDEPENDENT_AMBULATORY_CARE_PROVIDER_SITE_OTHER): Payer: Self-pay | Admitting: *Deleted

## 2023-08-16 DIAGNOSIS — M546 Pain in thoracic spine: Secondary | ICD-10-CM | POA: Diagnosis not present

## 2023-08-16 DIAGNOSIS — M9902 Segmental and somatic dysfunction of thoracic region: Secondary | ICD-10-CM | POA: Diagnosis not present

## 2023-08-16 DIAGNOSIS — M47812 Spondylosis without myelopathy or radiculopathy, cervical region: Secondary | ICD-10-CM | POA: Diagnosis not present

## 2023-08-16 DIAGNOSIS — M531 Cervicobrachial syndrome: Secondary | ICD-10-CM | POA: Diagnosis not present

## 2023-08-16 DIAGNOSIS — M9903 Segmental and somatic dysfunction of lumbar region: Secondary | ICD-10-CM | POA: Diagnosis not present

## 2023-08-16 DIAGNOSIS — S338XXA Sprain of other parts of lumbar spine and pelvis, initial encounter: Secondary | ICD-10-CM | POA: Diagnosis not present

## 2023-08-16 DIAGNOSIS — M9901 Segmental and somatic dysfunction of cervical region: Secondary | ICD-10-CM | POA: Diagnosis not present

## 2023-08-20 DIAGNOSIS — Z85828 Personal history of other malignant neoplasm of skin: Secondary | ICD-10-CM | POA: Diagnosis not present

## 2023-08-20 DIAGNOSIS — L821 Other seborrheic keratosis: Secondary | ICD-10-CM | POA: Diagnosis not present

## 2023-08-20 DIAGNOSIS — L570 Actinic keratosis: Secondary | ICD-10-CM | POA: Diagnosis not present

## 2023-08-27 ENCOUNTER — Ambulatory Visit (INDEPENDENT_AMBULATORY_CARE_PROVIDER_SITE_OTHER): Payer: PPO | Admitting: Gastroenterology

## 2023-09-03 DIAGNOSIS — M1711 Unilateral primary osteoarthritis, right knee: Secondary | ICD-10-CM | POA: Diagnosis not present

## 2023-09-03 DIAGNOSIS — M25511 Pain in right shoulder: Secondary | ICD-10-CM | POA: Diagnosis not present

## 2023-09-10 DIAGNOSIS — S46811A Strain of other muscles, fascia and tendons at shoulder and upper arm level, right arm, initial encounter: Secondary | ICD-10-CM | POA: Diagnosis not present

## 2023-09-10 DIAGNOSIS — M25411 Effusion, right shoulder: Secondary | ICD-10-CM | POA: Diagnosis not present

## 2023-09-10 DIAGNOSIS — M75111 Incomplete rotator cuff tear or rupture of right shoulder, not specified as traumatic: Secondary | ICD-10-CM | POA: Diagnosis not present

## 2023-09-10 DIAGNOSIS — S43431A Superior glenoid labrum lesion of right shoulder, initial encounter: Secondary | ICD-10-CM | POA: Diagnosis not present

## 2023-09-10 DIAGNOSIS — R936 Abnormal findings on diagnostic imaging of limbs: Secondary | ICD-10-CM | POA: Diagnosis not present

## 2023-09-10 DIAGNOSIS — M19011 Primary osteoarthritis, right shoulder: Secondary | ICD-10-CM | POA: Diagnosis not present

## 2023-09-10 DIAGNOSIS — M7551 Bursitis of right shoulder: Secondary | ICD-10-CM | POA: Diagnosis not present

## 2023-09-14 DIAGNOSIS — M531 Cervicobrachial syndrome: Secondary | ICD-10-CM | POA: Diagnosis not present

## 2023-09-14 DIAGNOSIS — M9903 Segmental and somatic dysfunction of lumbar region: Secondary | ICD-10-CM | POA: Diagnosis not present

## 2023-09-14 DIAGNOSIS — M546 Pain in thoracic spine: Secondary | ICD-10-CM | POA: Diagnosis not present

## 2023-09-14 DIAGNOSIS — M47812 Spondylosis without myelopathy or radiculopathy, cervical region: Secondary | ICD-10-CM | POA: Diagnosis not present

## 2023-09-14 DIAGNOSIS — S338XXA Sprain of other parts of lumbar spine and pelvis, initial encounter: Secondary | ICD-10-CM | POA: Diagnosis not present

## 2023-09-14 DIAGNOSIS — M9902 Segmental and somatic dysfunction of thoracic region: Secondary | ICD-10-CM | POA: Diagnosis not present

## 2023-09-14 DIAGNOSIS — M9901 Segmental and somatic dysfunction of cervical region: Secondary | ICD-10-CM | POA: Diagnosis not present

## 2023-09-17 DIAGNOSIS — M19011 Primary osteoarthritis, right shoulder: Secondary | ICD-10-CM | POA: Diagnosis not present

## 2023-09-28 DIAGNOSIS — J209 Acute bronchitis, unspecified: Secondary | ICD-10-CM | POA: Diagnosis not present

## 2023-09-28 DIAGNOSIS — E663 Overweight: Secondary | ICD-10-CM | POA: Diagnosis not present

## 2023-09-28 DIAGNOSIS — J45901 Unspecified asthma with (acute) exacerbation: Secondary | ICD-10-CM | POA: Diagnosis not present

## 2023-09-28 DIAGNOSIS — Z6825 Body mass index (BMI) 25.0-25.9, adult: Secondary | ICD-10-CM | POA: Diagnosis not present

## 2023-10-12 DIAGNOSIS — M9903 Segmental and somatic dysfunction of lumbar region: Secondary | ICD-10-CM | POA: Diagnosis not present

## 2023-10-12 DIAGNOSIS — S338XXA Sprain of other parts of lumbar spine and pelvis, initial encounter: Secondary | ICD-10-CM | POA: Diagnosis not present

## 2023-10-12 DIAGNOSIS — M9901 Segmental and somatic dysfunction of cervical region: Secondary | ICD-10-CM | POA: Diagnosis not present

## 2023-10-12 DIAGNOSIS — M9902 Segmental and somatic dysfunction of thoracic region: Secondary | ICD-10-CM | POA: Diagnosis not present

## 2023-10-12 DIAGNOSIS — M546 Pain in thoracic spine: Secondary | ICD-10-CM | POA: Diagnosis not present

## 2023-10-12 DIAGNOSIS — M47812 Spondylosis without myelopathy or radiculopathy, cervical region: Secondary | ICD-10-CM | POA: Diagnosis not present

## 2023-10-12 DIAGNOSIS — M531 Cervicobrachial syndrome: Secondary | ICD-10-CM | POA: Diagnosis not present

## 2023-11-09 DIAGNOSIS — M531 Cervicobrachial syndrome: Secondary | ICD-10-CM | POA: Diagnosis not present

## 2023-11-09 DIAGNOSIS — M9902 Segmental and somatic dysfunction of thoracic region: Secondary | ICD-10-CM | POA: Diagnosis not present

## 2023-11-09 DIAGNOSIS — M9903 Segmental and somatic dysfunction of lumbar region: Secondary | ICD-10-CM | POA: Diagnosis not present

## 2023-11-09 DIAGNOSIS — M546 Pain in thoracic spine: Secondary | ICD-10-CM | POA: Diagnosis not present

## 2023-11-09 DIAGNOSIS — S338XXA Sprain of other parts of lumbar spine and pelvis, initial encounter: Secondary | ICD-10-CM | POA: Diagnosis not present

## 2023-11-09 DIAGNOSIS — M47812 Spondylosis without myelopathy or radiculopathy, cervical region: Secondary | ICD-10-CM | POA: Diagnosis not present

## 2023-11-09 DIAGNOSIS — M9901 Segmental and somatic dysfunction of cervical region: Secondary | ICD-10-CM | POA: Diagnosis not present

## 2023-11-19 DIAGNOSIS — Z6824 Body mass index (BMI) 24.0-24.9, adult: Secondary | ICD-10-CM | POA: Diagnosis not present

## 2023-11-19 DIAGNOSIS — K219 Gastro-esophageal reflux disease without esophagitis: Secondary | ICD-10-CM | POA: Diagnosis not present

## 2023-11-19 DIAGNOSIS — R053 Chronic cough: Secondary | ICD-10-CM | POA: Diagnosis not present

## 2023-11-19 DIAGNOSIS — R197 Diarrhea, unspecified: Secondary | ICD-10-CM | POA: Diagnosis not present

## 2023-11-22 DIAGNOSIS — R197 Diarrhea, unspecified: Secondary | ICD-10-CM | POA: Diagnosis not present

## 2023-11-23 ENCOUNTER — Ambulatory Visit (HOSPITAL_COMMUNITY)
Admission: RE | Admit: 2023-11-23 | Discharge: 2023-11-23 | Disposition: A | Discharge status: Home / Self Care | Source: Ambulatory Visit | Attending: Internal Medicine | Admitting: Internal Medicine

## 2023-11-23 ENCOUNTER — Other Ambulatory Visit (HOSPITAL_COMMUNITY): Payer: Self-pay | Admitting: Internal Medicine

## 2023-11-23 DIAGNOSIS — R053 Chronic cough: Secondary | ICD-10-CM | POA: Diagnosis not present

## 2023-11-26 DIAGNOSIS — Z0001 Encounter for general adult medical examination with abnormal findings: Secondary | ICD-10-CM | POA: Diagnosis not present

## 2023-11-26 DIAGNOSIS — Z6824 Body mass index (BMI) 24.0-24.9, adult: Secondary | ICD-10-CM | POA: Diagnosis not present

## 2023-11-26 DIAGNOSIS — R7309 Other abnormal glucose: Secondary | ICD-10-CM | POA: Diagnosis not present

## 2023-11-26 DIAGNOSIS — Z1331 Encounter for screening for depression: Secondary | ICD-10-CM | POA: Diagnosis not present

## 2023-11-26 DIAGNOSIS — K219 Gastro-esophageal reflux disease without esophagitis: Secondary | ICD-10-CM | POA: Diagnosis not present

## 2023-11-27 ENCOUNTER — Other Ambulatory Visit (HOSPITAL_COMMUNITY): Payer: Self-pay | Admitting: Internal Medicine

## 2023-11-27 DIAGNOSIS — Z13820 Encounter for screening for osteoporosis: Secondary | ICD-10-CM

## 2023-11-27 DIAGNOSIS — M75101 Unspecified rotator cuff tear or rupture of right shoulder, not specified as traumatic: Secondary | ICD-10-CM | POA: Diagnosis not present

## 2023-11-27 DIAGNOSIS — M19011 Primary osteoarthritis, right shoulder: Secondary | ICD-10-CM | POA: Diagnosis not present

## 2023-12-03 ENCOUNTER — Ambulatory Visit (HOSPITAL_COMMUNITY)
Admission: RE | Admit: 2023-12-03 | Discharge: 2023-12-03 | Disposition: A | Discharge status: Home / Self Care | Source: Ambulatory Visit | Attending: Internal Medicine | Admitting: Internal Medicine

## 2023-12-03 DIAGNOSIS — Z13820 Encounter for screening for osteoporosis: Secondary | ICD-10-CM | POA: Insufficient documentation

## 2023-12-03 DIAGNOSIS — Z780 Asymptomatic menopausal state: Secondary | ICD-10-CM | POA: Diagnosis not present

## 2023-12-03 DIAGNOSIS — M8589 Other specified disorders of bone density and structure, multiple sites: Secondary | ICD-10-CM | POA: Diagnosis not present

## 2023-12-10 DIAGNOSIS — M9903 Segmental and somatic dysfunction of lumbar region: Secondary | ICD-10-CM | POA: Diagnosis not present

## 2023-12-10 DIAGNOSIS — M531 Cervicobrachial syndrome: Secondary | ICD-10-CM | POA: Diagnosis not present

## 2023-12-10 DIAGNOSIS — M9902 Segmental and somatic dysfunction of thoracic region: Secondary | ICD-10-CM | POA: Diagnosis not present

## 2023-12-10 DIAGNOSIS — M9901 Segmental and somatic dysfunction of cervical region: Secondary | ICD-10-CM | POA: Diagnosis not present

## 2023-12-10 DIAGNOSIS — M546 Pain in thoracic spine: Secondary | ICD-10-CM | POA: Diagnosis not present

## 2023-12-10 DIAGNOSIS — S338XXA Sprain of other parts of lumbar spine and pelvis, initial encounter: Secondary | ICD-10-CM | POA: Diagnosis not present

## 2023-12-10 DIAGNOSIS — M47812 Spondylosis without myelopathy or radiculopathy, cervical region: Secondary | ICD-10-CM | POA: Diagnosis not present

## 2024-01-07 DIAGNOSIS — M9903 Segmental and somatic dysfunction of lumbar region: Secondary | ICD-10-CM | POA: Diagnosis not present

## 2024-01-07 DIAGNOSIS — M47812 Spondylosis without myelopathy or radiculopathy, cervical region: Secondary | ICD-10-CM | POA: Diagnosis not present

## 2024-01-07 DIAGNOSIS — M546 Pain in thoracic spine: Secondary | ICD-10-CM | POA: Diagnosis not present

## 2024-01-07 DIAGNOSIS — M531 Cervicobrachial syndrome: Secondary | ICD-10-CM | POA: Diagnosis not present

## 2024-01-07 DIAGNOSIS — M9902 Segmental and somatic dysfunction of thoracic region: Secondary | ICD-10-CM | POA: Diagnosis not present

## 2024-01-07 DIAGNOSIS — M9901 Segmental and somatic dysfunction of cervical region: Secondary | ICD-10-CM | POA: Diagnosis not present

## 2024-01-07 DIAGNOSIS — S338XXA Sprain of other parts of lumbar spine and pelvis, initial encounter: Secondary | ICD-10-CM | POA: Diagnosis not present

## 2024-01-17 ENCOUNTER — Encounter (INDEPENDENT_AMBULATORY_CARE_PROVIDER_SITE_OTHER): Payer: Self-pay | Admitting: Gastroenterology

## 2024-01-17 ENCOUNTER — Ambulatory Visit (INDEPENDENT_AMBULATORY_CARE_PROVIDER_SITE_OTHER): Admitting: Gastroenterology

## 2024-01-17 VITALS — BP 103/61 | HR 86 | Temp 98.1°F | Ht 67.0 in | Wt 152.1 lb

## 2024-01-17 DIAGNOSIS — K529 Noninfective gastroenteritis and colitis, unspecified: Secondary | ICD-10-CM | POA: Diagnosis not present

## 2024-01-17 DIAGNOSIS — R197 Diarrhea, unspecified: Secondary | ICD-10-CM | POA: Insufficient documentation

## 2024-01-17 DIAGNOSIS — W57XXXA Bitten or stung by nonvenomous insect and other nonvenomous arthropods, initial encounter: Secondary | ICD-10-CM | POA: Diagnosis not present

## 2024-01-17 DIAGNOSIS — Z6824 Body mass index (BMI) 24.0-24.9, adult: Secondary | ICD-10-CM | POA: Diagnosis not present

## 2024-01-17 NOTE — Patient Instructions (Signed)
 Please ask PCP to fax report of most recent blood workup Perform stool workup Can continue taking Lomotil 2 pills twice a day for now If negative investigations, we will need to proceed with a colonoscopy for diagnostic purposes

## 2024-01-17 NOTE — Progress Notes (Signed)
 Toribio Fortune, M.D. Gastroenterology & Hepatology [facility] : [facility] [address] [place] [address] [place], [place] [address]  Primary Care Physician: Marvine Rush, MD [address] [place] [address]  I will communicate my assessment and recommendations to the referring MD via EMR.  Problems: GERD Schatzki's ring status post dilation Persistent diarrhea   History of Present Illness: Annette Vance is a 68 y.o. female with past medical history of GERD, history of Schatzki's ring, who presents for evaluation of diarrhea.  The patient was last seen on 07/12/2023. At that time, the patient was scheduled for EGD with findings described below.  She was also advised to increase pantoprazole  to 40 mg twice a day.  Patient reports that she has presented some issues with diarrhea since  middle of May 2025. She reports that she was presenting 4-5 Bms per day. States that she had presented recurrent issues with abdominal pain in her lower abdomen when she is about to have a bowel movements.The patient denies having any nausea, vomiting, fever, chills, hematochezia, melena, hematemesis, abdominal distention,   jaundice, pruritus . Has lost 7 lb since symptoms started.  States she had some tick bites around the time she had the diarrhea, possibly a couple of months before.  States in June her symptoms mildly improved as her stools were soft. She is having 2-3 Bms per day. States that she is still presenting. She is taking Lomotil 2 pills twice a day.   Patient had labs performed on 11/26/2023 which showed WBC 5.4, hemoglobin 12.5, platelets 291, CMP with sodium 137, potassium 4.6, calcium 9.4, AST 14, ALT 13, alkaline phosphatase 77, total bilirubin 0.5, albumin 3.8, BUN 12, creatinine 0.83, TSH 0.69, negative stool culture, Giardia and stool and C. difficile, negative Cyclospora in stool. She had repeat labs performed today at her PCPs office - she will ask to have  these tests faxed to my office.  Last EGD: 07/31/2023 - No endoscopic esophageal abnormality to explain patient' s dysphagia. Esophagus dilated. - 2 cm hiatal hernia. - Normal stomach. - Normal examined duodenum.  Biopsies were normal.  Last Colonoscopy: 2016 Prep excellent.Redundant colon.Normal mucosa of cecum, ascending colon, hepatic flexure, transverse colon, splenic flexure, descending and sigmoid colon.Normal rectal mucosa. Prominent hemorrhoids below the dentate line  Past Medical History: Past Medical History:  Diagnosis Date   Arthritis    Bradycardia    Chest discomfort    Dysphagia    GERD (gastroesophageal reflux disease)    Heart palpitations     Past Surgical History: Past Surgical History:  Procedure Laterality Date   BALLOON DILATION N/A 12/25/2013   Procedure: BALLOON DILATION;  Surgeon: Claudis RAYMOND Rivet, MD;  [facility];  Service: Endoscopy;  Laterality: N/A;   BIOPSY  12/25/2013   Procedure: BIOPSY;  Surgeon: Claudis RAYMOND Rivet, MD;  [facility];  Service: Endoscopy;;   BIOPSY  07/31/2023   Procedure: BIOPSY;  Surgeon: Fortune Angelia Toribio, MD;  [facility];  Service: Gastroenterology;;   CATARACT EXTRACTION Bilateral    one week apart. last week of september and first week of october   COLONOSCOPY N/A 07/29/2014   Procedure: COLONOSCOPY;  Surgeon: Claudis RAYMOND Rivet, MD;  [facility];  Service: Endoscopy;  Laterality: N/A;  1025 - moved to 9:25 - Ann to notify pt   ESOPHAGEAL DILATION N/A 01/03/2019   Procedure: ESOPHAGEAL DILATION;  Surgeon: Rivet Claudis RAYMOND, MD;  [facility];  Service: Endoscopy;  Laterality: N/A;   ESOPHAGEAL DILATION N/A 06/29/2021   Procedure: ESOPHAGEAL DILATION;  Surgeon: Eartha Angelia Sieving, MD;  [facility];  Service: Gastroenterology;  Laterality: N/A;   ESOPHAGOGASTRODUODENOSCOPY N/A 12/25/2013   Procedure: ESOPHAGOGASTRODUODENOSCOPY (EGD);  Surgeon: Claudis RAYMOND Rivet, MD;  [facility];  Service: Endoscopy;  Laterality: N/A;  1255   ESOPHAGOGASTRODUODENOSCOPY (EGD) WITH PROPOFOL  N/A 01/03/2019   Procedure: ESOPHAGOGASTRODUODENOSCOPY (EGD) WITH PROPOFOL ;  Surgeon: Rivet Claudis RAYMOND, MD;  [facility];  Service: Endoscopy;  Laterality: N/A;  12:30   ESOPHAGOGASTRODUODENOSCOPY (EGD) WITH PROPOFOL  N/A 06/29/2021   Procedure: ESOPHAGOGASTRODUODENOSCOPY (EGD) WITH PROPOFOL ;  Surgeon: Eartha Angelia Sieving, MD;  [facility];  Service: Gastroenterology;  Laterality: N/A;  9:00   ESOPHAGOGASTRODUODENOSCOPY (EGD) WITH PROPOFOL  N/A 07/31/2023   Procedure: ESOPHAGOGASTRODUODENOSCOPY (EGD) WITH PROPOFOL ;  Surgeon: Eartha Angelia Sieving, MD;  [facility];  Service: Gastroenterology;  Laterality: N/A;  11:00 am, asa 1   HEMORRHOID SURGERY     Laser Surgery with Dr. Floria   HEMORRHOID SURGERY N/A 07/10/2018   Procedure: EXTENSIVE HEMORRHOIDECTOMY;  Surgeon: Kallie Manuelita BROCKS, MD;  [facility]: [facility];  Service: General;  Laterality: N/A;   KNEE SURGERY Left    1996   SHOULDER ARTHROSCOPY Right 07/2018   WRIST SURGERY Left    dequavains    Family History: Family History  Problem Relation Age of Onset   Dementia Father    Stroke Paternal Grandfather    Hypertension Paternal Grandfather    Diabetes Brother     Social History: Social History   Tobacco Use  Smoking Status Never  Smokeless Tobacco Never   Social History   Substance and Sexual Activity  Alcohol Use Yes   Comment: occasional   Social History   Substance and Sexual Activity  Drug Use No    Allergies: No Known Allergies  Medications: Current Outpatient Medications  Medication Sig Dispense Refill   CALCIUM-VITAMIN D PO Take 1 tablet by mouth daily.     Coenzyme Q10 (CO Q 10 PO) Take 1 tablet by mouth daily.     Glycerin-Hypromellose-PEG 400 (DRY EYE RELIEF DROPS) 0.2-0.2-1 % SOLN Place 1 drop into both ears 2 (two) times daily.      Homeopathic Products (ARNICA EX) Apply 1 application topically 2 (two) times daily.     omega-3 acid ethyl esters (LOVAZA) 1 g capsule Take 2 g by mouth daily.     pantoprazole  (PROTONIX ) 40 MG tablet Take 1 tablet (40 mg total) by mouth 2 (two) times daily. 180 tablet 3   Red Yeast Rice Extract (RED YEAST RICE PO) Take 2 tablets by mouth daily.     valACYclovir (VALTREX) 1000 MG tablet Take 1,000 mg by mouth 2 (two) times daily as needed (flair up).     No current facility-administered medications for this visit.    Review of Systems: GENERAL: negative for malaise, night sweats HEENT: No changes in hearing or vision, no nose bleeds or other nasal problems. NECK: Negative for lumps, goiter, pain and significant neck swelling RESPIRATORY: Negative for cough, wheezing CARDIOVASCULAR: Negative for chest pain, leg swelling, palpitations, orthopnea GI: SEE HPI MUSCULOSKELETAL: Negative for joint pain or swelling, back pain, and muscle pain. SKIN: Negative for lesions, rash PSYCH: Negative for sleep disturbance, mood disorder and recent psychosocial stressors. HEMATOLOGY Negative for prolonged bleeding, bruising easily, and swollen nodes. ENDOCRINE: Negative for cold or heat intolerance, polyuria, polydipsia and goiter. NEURO: negative for tremor, gait imbalance, syncope and  seizures. The remainder of the review of systems is noncontributory.   Physical Exam: There were no vitals taken for this visit. GENERAL: The patient is AO x3, in no acute distress. HEENT: Head is normocephalic and atraumatic. EOMI are intact. Mouth is well hydrated and without lesions. NECK: Supple. No masses LUNGS: Clear to auscultation. No presence of rhonchi/wheezing/rales. Adequate chest expansion HEART: RRR, normal s1 and s2. ABDOMEN: Soft, nontender, no guarding, no peritoneal signs, and nondistended. BS +. No masses. EXTREMITIES: Without any cyanosis, clubbing, rash, lesions or edema. NEUROLOGIC: AOx3, no  focal motor deficit. SKIN: no jaundice, no rashes  Imaging/Labs: as above  I personally reviewed and interpreted the available labs, imaging and endoscopic files.  Impression and Plan: Annette Vance is a 68 y.o. female with past medical history of GERD, history of Schatzki's ring, who presents for evaluation of diarrhea.  Patient has presented ongoing recurrent issues with diarrhea since May.  Has not started any recent medications.  Notably, she had recent tick bites prior to onset of symptoms.  I discussed with the patient that we will need to evaluate for etiologies such as alpha gal and celiac disease with serologies, but we will need to obtain the results of the recent blood workup performed by her PCP.  We will complete the workup for other organic etiologies with stool testing.  She can continue taking Lomotil as needed for now to alleviate her symptoms.  -Patient will ask PCP to fax report of most recent blood workup -Check fecal calprotectin and GI pathogen panel -Can continue taking Lomotil 2 pills twice a day for now -If negative investigations, we will need to proceed with a colonoscopy for diagnostic purposes  All questions were answered.      Toribio Fortune, MD Gastroenterology and Hepatology [facility]

## 2024-01-22 ENCOUNTER — Other Ambulatory Visit (INDEPENDENT_AMBULATORY_CARE_PROVIDER_SITE_OTHER): Payer: Self-pay

## 2024-01-22 ENCOUNTER — Telehealth: Payer: Self-pay | Admitting: Gastroenterology

## 2024-01-22 DIAGNOSIS — K529 Noninfective gastroenteritis and colitis, unspecified: Secondary | ICD-10-CM

## 2024-01-22 DIAGNOSIS — R197 Diarrhea, unspecified: Secondary | ICD-10-CM | POA: Diagnosis not present

## 2024-01-22 NOTE — Telephone Encounter (Signed)
 I received the blood workup from the most recent testing performed on 11/26/2023.  CBC showed WBC 5.4, hemoglobin 12.5 and platelets 291, CMP with creatinine 0.83, BUN 12, sodium 137, potassium 4.6, albumin 3.8, AST 14, ALT 13, total bilirubin 0.5, TSH 0.64, stool testing negative stool culture for Shigella, Salmonella, Campylobacter, Cyclospora, C. difficile or Giardia.  Hi Crystal,  Can you please call the patient and tell the patient I reviewed the labs and there are some tests that have not been ordered.  Please send him an order for celiac disease panel and alpha gal panel.  Diagnosis: Chronic diarrhea  Thanks,  Toribio Fortune, MD Gastroenterology and Hepatology [facility]

## 2024-01-22 NOTE — Telephone Encounter (Signed)
 I spoke with the patient and made her aware per Dr. Eartha, I received the blood workup from the most recent testing performed on 11/26/2023.  CBC showed WBC 5.4, hemoglobin 12.5 and platelets 291, CMP with creatinine 0.83, BUN 12, sodium 137, potassium 4.6, albumin 3.8, AST 14, ALT 13, total bilirubin 0.5, TSH 0.64, stool testing negative stool culture for Shigella, Salmonella, Campylobacter, Cyclospora, C. difficile or Giardia.   Hi Keir Foland,   Can you please call the patient and tell the patient I reviewed the labs and there are some tests that have not been ordered.  Please send him an order for celiac disease panel and alpha gal panel.  Diagnosis: Chronic diarrhea  Patient agrees to have these two tests done at [facility] lab this week. Orders placed in Epic.

## 2024-01-23 DIAGNOSIS — K529 Noninfective gastroenteritis and colitis, unspecified: Secondary | ICD-10-CM | POA: Diagnosis not present

## 2024-01-29 LAB — GASTROINTESTINAL PATHOGEN PNL
CampyloBacter Group: NOT DETECTED
Norovirus GI/GII: NOT DETECTED
Rotavirus A: NOT DETECTED
Salmonella species: NOT DETECTED
Shiga Toxin 1: NOT DETECTED
Shiga Toxin 2: NOT DETECTED
Shigella Species: NOT DETECTED
Vibrio Group: NOT DETECTED
Yersinia enterocolitica: NOT DETECTED

## 2024-01-29 LAB — CALPROTECTIN: Calprotectin: 116 ug/g

## 2024-01-29 LAB — ALPHA-GAL PANEL
Allergen, Mutton, f88: <0.1 kU/L
Allergen, Pork, f26: <0.1 kU/L
Beef: <0.1 kU/L
CLASS: 0
CLASS: 0
Class: 0
GALACTOSE-ALPHA-1,3-GALACTOSE IGE*: 0.27 kU/L — ABNORMAL HIGH (ref <0.10)

## 2024-01-29 LAB — INTERPRETATION:

## 2024-01-29 LAB — CELIAC DISEASE PANEL
(tTG) Ab, IgA: <1 U/mL
(tTG) Ab, IgG: <1 U/mL
Gliadin IgA: <1 U/mL
Gliadin IgG: <1 U/mL
Immunoglobulin A: 127 mg/dL (ref 70–320)

## 2024-01-31 ENCOUNTER — Telehealth (INDEPENDENT_AMBULATORY_CARE_PROVIDER_SITE_OTHER): Payer: Self-pay | Admitting: Gastroenterology

## 2024-01-31 NOTE — Telephone Encounter (Signed)
 Pt called to make OV. I told her she was on the OCT recall to follow up and that schedule is not available, but I could get her scheduled in September. She is wanting to be seen before August 21. I told her there was nothing available before August 21. She said all her lab results were back and she was planning a surgery and needed to see someone before then. I have her noted to call if I have any cancellations. [phone]

## 2024-02-01 NOTE — Telephone Encounter (Signed)
 I spoke with the patient and she says she has had diarrhea for the last three months, but thinks she is getting better as of last night and this morning she is feeling much better stools are forming some, they are not back to normal but thinks she is getting there. Patient had recent labs and would like the doctor to review them and call her if anything is abnormal. She is aware we have no available appointment in the next week or so and aware we have her on a cancellation list. Patient is ok with this.

## 2024-02-04 DIAGNOSIS — M531 Cervicobrachial syndrome: Secondary | ICD-10-CM | POA: Diagnosis not present

## 2024-02-04 DIAGNOSIS — M546 Pain in thoracic spine: Secondary | ICD-10-CM | POA: Diagnosis not present

## 2024-02-04 DIAGNOSIS — M47812 Spondylosis without myelopathy or radiculopathy, cervical region: Secondary | ICD-10-CM | POA: Diagnosis not present

## 2024-02-04 DIAGNOSIS — M9901 Segmental and somatic dysfunction of cervical region: Secondary | ICD-10-CM | POA: Diagnosis not present

## 2024-02-04 DIAGNOSIS — M9902 Segmental and somatic dysfunction of thoracic region: Secondary | ICD-10-CM | POA: Diagnosis not present

## 2024-02-04 DIAGNOSIS — S338XXA Sprain of other parts of lumbar spine and pelvis, initial encounter: Secondary | ICD-10-CM | POA: Diagnosis not present

## 2024-02-04 DIAGNOSIS — M9903 Segmental and somatic dysfunction of lumbar region: Secondary | ICD-10-CM | POA: Diagnosis not present

## 2024-02-05 ENCOUNTER — Ambulatory Visit (INDEPENDENT_AMBULATORY_CARE_PROVIDER_SITE_OTHER): Payer: Self-pay | Admitting: Gastroenterology

## 2024-02-05 ENCOUNTER — Encounter (INDEPENDENT_AMBULATORY_CARE_PROVIDER_SITE_OTHER): Payer: Self-pay | Admitting: Gastroenterology

## 2024-02-05 ENCOUNTER — Ambulatory Visit (INDEPENDENT_AMBULATORY_CARE_PROVIDER_SITE_OTHER): Admitting: Gastroenterology

## 2024-02-05 VITALS — BP 117/69 | HR 74 | Temp 97.5°F | Ht 67.0 in | Wt 152.1 lb

## 2024-02-05 DIAGNOSIS — R197 Diarrhea, unspecified: Secondary | ICD-10-CM

## 2024-02-05 DIAGNOSIS — Z91014 Allergy to mammalian meats: Secondary | ICD-10-CM | POA: Insufficient documentation

## 2024-02-05 DIAGNOSIS — K529 Noninfective gastroenteritis and colitis, unspecified: Secondary | ICD-10-CM

## 2024-02-05 DIAGNOSIS — K219 Gastro-esophageal reflux disease without esophagitis: Secondary | ICD-10-CM

## 2024-02-05 MED ORDER — DIPHENOXYLATE-ATROPINE 2.5-0.025 MG PO TABS: 1.0000 | ORAL_TABLET | Freq: Four times a day (QID) | ORAL | 1 refills | Status: AC | PRN

## 2024-02-05 NOTE — Patient Instructions (Signed)
 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 I want to create trusting relationships with patients and provide genuine, compassionate, and quality care. I truly value your feedback! please be on the lookout for a survey regarding your visit with me today. I appreciate your input about our visit and your time in completing this!    Leatta Alewine L. Jeric Slagel, MSN, APRN, AGNP-C Adult-Gerontology Nurse Practitioner [facility] Gastroenterology at [facility]

## 2024-02-05 NOTE — Progress Notes (Signed)
 Referring Provider: Marvine Rush, MD Primary Care Physician:  Marvine Rush, MD Primary GI Physician: Dr. Eartha   Chief Complaint  Patient presents with   Follow-up    Patient here today for a follow up on Diarrhea. Patient says this is much better. She says she has been using Diphenoxylate  2.5 mg prn.   HPI:   Annette Vance is a 68 y.o. female with past medical history of GERD, history of Schatzki's ring, who presents for evaluation of diarrhea.   Patient presenting today for:  Follow up of diarrhea and GERD  Last seen July, at that time having diarrhea since may with 4-5 Bms per day, some lower abdominal pain. Recent tick bites prior to onset. Taking lomotil  BID.   Recommend check calprotectin, GI pathogen panel, continue lomotil , colonoscopy if negative evaluations.  Labs on 8/5 and 8/6 with normal calprotectin, negative GI pathogen panel, negative celiac panel, Alpha gal panel with galactose IgE elevated at 0.27  Present:  Diarrhea has improved some. She is using lomotil  but trying not to take it regularly. No dietary changes, she is avoiding alcohol. She tries to eat healthy. She denies abdominal pain, sometimes stomach feels unsettled. She does note two tick bites earlier this year. She does not eat a lot of red meat but does eat pork sometimes. She does note that she was on a course of steroids for a short time for her shoulder and felt that diarrhea improved then. No rectal bleeding or melena. She denies any rashes, sob or swelling associated with her diarrhea.   Taking protonix  40mg  daily, feels GERD is well controlled. No dysphagia or odynophagia.  Last EGD: 07/31/2023 - No endoscopic esophageal abnormality to explain patient' s dysphagia. Esophagus dilated. - 2 cm hiatal hernia. - Normal stomach. - Normal examined duodenum.  Biopsies were normal.   Last Colonoscopy: 06/2014 Prep excellent.Redundant colon.Normal mucosa of cecum, ascending colon, hepatic flexure,  transverse colon, splenic flexure, descending and sigmoid colon.Normal rectal mucosa. Prominent hemorrhoids below the dentate line   Filed Weights   02/05/24 1410  Weight: 152 lb 1.6 oz (69 kg)     Past Medical History:  Diagnosis Date   Arthritis    Bradycardia    Chest discomfort    Dysphagia    GERD (gastroesophageal reflux disease)    Heart palpitations     Past Surgical History:  Procedure Laterality Date   BALLOON DILATION N/A 12/25/2013   Procedure: BALLOON DILATION;  Surgeon: Claudis RAYMOND Rivet, MD;  [facility];  Service: Endoscopy;  Laterality: N/A;   BIOPSY  12/25/2013   Procedure: BIOPSY;  Surgeon: Claudis RAYMOND Rivet, MD;  [facility];  Service: Endoscopy;;   BIOPSY  07/31/2023   Procedure: BIOPSY;  Surgeon: Eartha Angelia Sieving, MD;  [facility];  Service: Gastroenterology;;   CATARACT EXTRACTION Bilateral    one week apart. last week of september and first week of october   COLONOSCOPY N/A 07/29/2014   Procedure: COLONOSCOPY;  Surgeon: Claudis RAYMOND Rivet, MD;  [facility];  Service: Endoscopy;  Laterality: N/A;  1025 - moved to 9:25 - Ann to notify pt   ESOPHAGEAL DILATION N/A 01/03/2019   Procedure: ESOPHAGEAL DILATION;  Surgeon: Rivet Claudis RAYMOND, MD;  [facility];  Service: Endoscopy;  Laterality: N/A;   ESOPHAGEAL DILATION N/A 06/29/2021   Procedure: ESOPHAGEAL DILATION;  Surgeon: Eartha Angelia Sieving, MD;  [facility];  Service: Gastroenterology;  Laterality: N/A;  ESOPHAGOGASTRODUODENOSCOPY N/A 12/25/2013   Procedure: ESOPHAGOGASTRODUODENOSCOPY (EGD);  Surgeon: Claudis RAYMOND Rivet, MD;  [facility];  Service: Endoscopy;  Laterality: N/A;  1255   ESOPHAGOGASTRODUODENOSCOPY (EGD) WITH PROPOFOL  N/A 01/03/2019   Procedure: ESOPHAGOGASTRODUODENOSCOPY (EGD) WITH PROPOFOL ;  Surgeon: Rivet Claudis RAYMOND, MD;  [facility];  Service: Endoscopy;  Laterality: N/A;  12:30    ESOPHAGOGASTRODUODENOSCOPY (EGD) WITH PROPOFOL  N/A 06/29/2021   Procedure: ESOPHAGOGASTRODUODENOSCOPY (EGD) WITH PROPOFOL ;  Surgeon: Eartha Angelia Sieving, MD;  [facility];  Service: Gastroenterology;  Laterality: N/A;  9:00   ESOPHAGOGASTRODUODENOSCOPY (EGD) WITH PROPOFOL  N/A 07/31/2023   Procedure: ESOPHAGOGASTRODUODENOSCOPY (EGD) WITH PROPOFOL ;  Surgeon: Eartha Angelia Sieving, MD;  [facility];  Service: Gastroenterology;  Laterality: N/A;  11:00 am, asa 1   HEMORRHOID SURGERY     Laser Surgery with Dr. Floria   HEMORRHOID SURGERY N/A 07/10/2018   Procedure: EXTENSIVE HEMORRHOIDECTOMY;  Surgeon: Kallie Manuelita BROCKS, MD;  [facility];  Service: General;  Laterality: N/A;   KNEE SURGERY Left    1996   SHOULDER ARTHROSCOPY Right 07/2018   WRIST SURGERY Left    dequavains    Current Outpatient Medications  Medication Sig Dispense Refill   CALCIUM-VITAMIN D PO Take 1 tablet by mouth daily.     Coenzyme Q10 (CO Q 10 PO) Take 1 tablet by mouth daily.     diphenoxylate -atropine  (LOMOTIL ) 2.5-0.025 MG tablet Take 1-2 tablets by mouth 4 (four) times daily as needed.     Glycerin-Hypromellose-PEG 400 (DRY EYE RELIEF DROPS) 0.2-0.2-1 % SOLN Place 1 drop into both ears 2 (two) times daily.     Homeopathic Products (ARNICA EX) Apply 1 application topically 2 (two) times daily. (Patient taking differently: Apply 1 application  topically as needed.)     omega-3 acid ethyl esters (LOVAZA) 1 g capsule Take 2 g by mouth daily.     pantoprazole  (PROTONIX ) 40 MG tablet Take 1 tablet (40 mg total) by mouth 2 (two) times daily. (Patient taking differently: Take 40 mg by mouth daily.) 180 tablet 3   valACYclovir (VALTREX) 1000 MG tablet Take 1,000 mg by mouth 2 (two) times daily as needed (flair up).     No current facility-administered medications for this visit.    Allergies as of 02/05/2024   (No Known Allergies)    Social History   Socioeconomic History    Marital status: Married    Spouse name: Not on file   Number of children: 1   Years of education: Not on file   Highest education level: Not on file  Occupational History   Occupation: retired  Tobacco Use   Smoking status: Never   Smokeless tobacco: Never  Vaping Use   Vaping status: Never Used  Substance and Sexual Activity   Alcohol use: Yes    Comment: occasional   Drug use: No   Sexual activity: Yes  Other Topics Concern   Not on file  Social History Narrative   Not on file   Social Drivers of Health   Financial Resource Strain: Not on file  Food Insecurity: Not on file  Transportation Needs: Not on file  Physical Activity: Not on file  Stress: Not on file  Social Connections: Unknown (11/01/2021)   Received from [facility]   Social Network    Social Network: Not on file    Review of systems General: negative for malaise, night sweats, fever, chills, weight loss Neck: Negative for lumps, goiter, pain and significant neck  swelling Resp: Negative for cough, wheezing, dyspnea at rest CV: Negative for chest pain, leg swelling, palpitations, orthopnea GI: denies melena, hematochezia, nausea, vomiting, constipation, dysphagia, odyonophagia, early satiety or unintentional weight loss. +diarrhea  MSK: Negative for joint pain or swelling, back pain, and muscle pain. Derm: Negative for itching or rash Psych: Denies depression, anxiety, memory loss, confusion. No homicidal or suicidal ideation.  Heme: Negative for prolonged bleeding, bruising easily, and swollen nodes. Endocrine: Negative for cold or heat intolerance, polyuria, polydipsia and goiter. Neuro: negative for tremor, gait imbalance, syncope and seizures. The remainder of the review of systems is noncontributory.  Physical Exam: BP 117/69 (BP Location: Left Arm, Patient Position: Sitting, Cuff Size: Normal)   Pulse 74   Temp (!) 97.5 F (36.4 C) (Temporal)   Ht 5' 7 (1.702 m)   Wt 152 lb 1.6 oz (69 kg)    BMI 23.82 kg/m  General:   Alert and oriented. No distress noted. Pleasant and cooperative.  Head:  Normocephalic and atraumatic. Eyes:  Conjuctiva clear without scleral icterus. Mouth:  Oral mucosa pink and moist. Good dentition. No lesions. Heart: Normal rate and rhythm, s1 and s2 heart sounds present.  Lungs: Clear lung sounds in all lobes. Respirations equal and unlabored. Abdomen:  +BS, soft, non-tender and non-distended. No rebound or guarding. No HSM or masses noted. Derm: No palmar erythema or jaundice Msk:  Symmetrical without gross deformities. Normal posture. Extremities:  Without edema. Neurologic:  Alert and  oriented x4 Psych:  Alert and cooperative. Normal mood and affect.  Invalid input(s): 6 MONTHS   ASSESSMENT: ZANOVIA ROTZ is a 68 y.o. 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 She has had some improvement in her diarrhea, only using lomotil  PRN. She consumes pork on occasion but usually eats more [place] and chicken, no hives, shortness of breath or swelling associated with her diarrhea. We discussed importance of avoiding mammal meats and dairy in regards to this. She can continue to use lomotil  as needed. She is due for Colonoscopy in January 2026, if she were to develop any new LGI symptoms or worsening of her diarrhea despite avoiding alpha gal containing foods, would need to consider sooner colonoscopy.   GERD: well controlled on protonix  40mg  daily. No breakthrough, dysphagia or odynophagia. Will continue with current PPI regimen.    PLAN:  -avoid mammal meat, dairy (alpha gal handout provided) -can use lomotil  PRN, refill sent -consider sooner colonoscopy if new or worsening LGI symptoms -continue protonix  40mg  daily  All questions were answered, patient verbalized understanding and is in agreement with plan as outlined above.   Follow Up: 3 months   Annette Seda L.  Nakaya Mishkin, MSN, APRN, AGNP-C Adult-Gerontology Nurse Practitioner [facility]  I have reviewed the note and agree with the APP's assessment as described in this progress note.  If still having symptoms, may want to discuss avoidance of gelatin capsules as this may lead to persistent symptoms in alpha gal.  Toribio Fortune, MD Gastroenterology and Hepatology [facility] Gastroenterology

## 2024-02-06 NOTE — Telephone Encounter (Signed)
 Patient seen in the office yesterday by [facility]  Results were addressed at that time.

## 2024-02-14 ENCOUNTER — Encounter (INDEPENDENT_AMBULATORY_CARE_PROVIDER_SITE_OTHER): Admitting: Gastroenterology

## 2024-03-03 DIAGNOSIS — M75101 Unspecified rotator cuff tear or rupture of right shoulder, not specified as traumatic: Secondary | ICD-10-CM | POA: Diagnosis not present

## 2024-03-03 DIAGNOSIS — M25511 Pain in right shoulder: Secondary | ICD-10-CM | POA: Diagnosis not present

## 2024-03-03 DIAGNOSIS — M9901 Segmental and somatic dysfunction of cervical region: Secondary | ICD-10-CM | POA: Diagnosis not present

## 2024-03-03 DIAGNOSIS — M9903 Segmental and somatic dysfunction of lumbar region: Secondary | ICD-10-CM | POA: Diagnosis not present

## 2024-03-03 DIAGNOSIS — M47812 Spondylosis without myelopathy or radiculopathy, cervical region: Secondary | ICD-10-CM | POA: Diagnosis not present

## 2024-03-03 DIAGNOSIS — M9902 Segmental and somatic dysfunction of thoracic region: Secondary | ICD-10-CM | POA: Diagnosis not present

## 2024-03-03 DIAGNOSIS — M531 Cervicobrachial syndrome: Secondary | ICD-10-CM | POA: Diagnosis not present

## 2024-03-03 DIAGNOSIS — M546 Pain in thoracic spine: Secondary | ICD-10-CM | POA: Diagnosis not present

## 2024-03-03 DIAGNOSIS — M19011 Primary osteoarthritis, right shoulder: Secondary | ICD-10-CM | POA: Diagnosis not present

## 2024-03-03 DIAGNOSIS — S338XXA Sprain of other parts of lumbar spine and pelvis, initial encounter: Secondary | ICD-10-CM | POA: Diagnosis not present

## 2024-03-10 DIAGNOSIS — L821 Other seborrheic keratosis: Secondary | ICD-10-CM | POA: Diagnosis not present

## 2024-03-10 DIAGNOSIS — L814 Other melanin hyperpigmentation: Secondary | ICD-10-CM | POA: Diagnosis not present

## 2024-03-10 DIAGNOSIS — Z85828 Personal history of other malignant neoplasm of skin: Secondary | ICD-10-CM | POA: Diagnosis not present

## 2024-03-10 DIAGNOSIS — L570 Actinic keratosis: Secondary | ICD-10-CM | POA: Diagnosis not present

## 2024-03-13 DIAGNOSIS — M25511 Pain in right shoulder: Secondary | ICD-10-CM | POA: Diagnosis not present

## 2024-03-13 DIAGNOSIS — M19011 Primary osteoarthritis, right shoulder: Secondary | ICD-10-CM | POA: Diagnosis not present

## 2024-03-13 DIAGNOSIS — M75101 Unspecified rotator cuff tear or rupture of right shoulder, not specified as traumatic: Secondary | ICD-10-CM | POA: Diagnosis not present

## 2024-03-28 ENCOUNTER — Other Ambulatory Visit: Payer: Self-pay | Admitting: Medical Genetics

## 2024-03-31 ENCOUNTER — Other Ambulatory Visit (HOSPITAL_COMMUNITY)
Admission: RE | Admit: 2024-03-31 | Discharge: 2024-03-31 | Disposition: A | Discharge status: Home / Self Care | Payer: Self-pay | Source: Ambulatory Visit | Attending: Medical Genetics | Admitting: Medical Genetics

## 2024-03-31 DIAGNOSIS — M9903 Segmental and somatic dysfunction of lumbar region: Secondary | ICD-10-CM | POA: Diagnosis not present

## 2024-03-31 DIAGNOSIS — S338XXA Sprain of other parts of lumbar spine and pelvis, initial encounter: Secondary | ICD-10-CM | POA: Diagnosis not present

## 2024-03-31 DIAGNOSIS — M546 Pain in thoracic spine: Secondary | ICD-10-CM | POA: Diagnosis not present

## 2024-03-31 DIAGNOSIS — M9902 Segmental and somatic dysfunction of thoracic region: Secondary | ICD-10-CM | POA: Diagnosis not present

## 2024-03-31 DIAGNOSIS — M47812 Spondylosis without myelopathy or radiculopathy, cervical region: Secondary | ICD-10-CM | POA: Diagnosis not present

## 2024-03-31 DIAGNOSIS — M9901 Segmental and somatic dysfunction of cervical region: Secondary | ICD-10-CM | POA: Diagnosis not present

## 2024-03-31 DIAGNOSIS — M531 Cervicobrachial syndrome: Secondary | ICD-10-CM | POA: Diagnosis not present

## 2024-04-02 ENCOUNTER — Encounter (INDEPENDENT_AMBULATORY_CARE_PROVIDER_SITE_OTHER): Payer: Self-pay | Admitting: Gastroenterology

## 2024-04-08 LAB — GENECONNECT MOLECULAR SCREEN: Genetic Analysis Overall Interpretation: NEGATIVE

## 2024-04-11 DIAGNOSIS — M2559 Pain in other specified joint: Secondary | ICD-10-CM | POA: Diagnosis not present

## 2024-04-11 DIAGNOSIS — Z01818 Encounter for other preprocedural examination: Secondary | ICD-10-CM | POA: Diagnosis not present

## 2024-04-17 DIAGNOSIS — M75101 Unspecified rotator cuff tear or rupture of right shoulder, not specified as traumatic: Secondary | ICD-10-CM | POA: Diagnosis not present

## 2024-04-17 DIAGNOSIS — M25511 Pain in right shoulder: Secondary | ICD-10-CM | POA: Diagnosis not present

## 2024-04-17 DIAGNOSIS — M19011 Primary osteoarthritis, right shoulder: Secondary | ICD-10-CM | POA: Diagnosis not present

## 2024-04-25 DIAGNOSIS — M47812 Spondylosis without myelopathy or radiculopathy, cervical region: Secondary | ICD-10-CM | POA: Diagnosis not present

## 2024-04-25 DIAGNOSIS — M546 Pain in thoracic spine: Secondary | ICD-10-CM | POA: Diagnosis not present

## 2024-04-25 DIAGNOSIS — M531 Cervicobrachial syndrome: Secondary | ICD-10-CM | POA: Diagnosis not present

## 2024-04-25 DIAGNOSIS — M9902 Segmental and somatic dysfunction of thoracic region: Secondary | ICD-10-CM | POA: Diagnosis not present

## 2024-04-25 DIAGNOSIS — M9901 Segmental and somatic dysfunction of cervical region: Secondary | ICD-10-CM | POA: Diagnosis not present

## 2024-04-25 DIAGNOSIS — M9903 Segmental and somatic dysfunction of lumbar region: Secondary | ICD-10-CM | POA: Diagnosis not present

## 2024-04-25 DIAGNOSIS — S338XXA Sprain of other parts of lumbar spine and pelvis, initial encounter: Secondary | ICD-10-CM | POA: Diagnosis not present

## 2024-04-29 DIAGNOSIS — K219 Gastro-esophageal reflux disease without esophagitis: Secondary | ICD-10-CM | POA: Diagnosis not present

## 2024-04-29 DIAGNOSIS — M19011 Primary osteoarthritis, right shoulder: Secondary | ICD-10-CM | POA: Diagnosis not present

## 2024-04-29 DIAGNOSIS — Z471 Aftercare following joint replacement surgery: Secondary | ICD-10-CM | POA: Diagnosis not present

## 2024-04-29 DIAGNOSIS — Z96611 Presence of right artificial shoulder joint: Secondary | ICD-10-CM | POA: Diagnosis not present

## 2024-04-29 DIAGNOSIS — Z79899 Other long term (current) drug therapy: Secondary | ICD-10-CM | POA: Diagnosis not present

## 2024-04-29 DIAGNOSIS — M7541 Impingement syndrome of right shoulder: Secondary | ICD-10-CM | POA: Diagnosis not present

## 2024-04-29 DIAGNOSIS — K449 Diaphragmatic hernia without obstruction or gangrene: Secondary | ICD-10-CM | POA: Diagnosis not present

## 2024-05-05 DIAGNOSIS — S43431A Superior glenoid labrum lesion of right shoulder, initial encounter: Secondary | ICD-10-CM | POA: Diagnosis not present

## 2024-05-05 DIAGNOSIS — M19011 Primary osteoarthritis, right shoulder: Secondary | ICD-10-CM | POA: Diagnosis not present

## 2024-05-05 DIAGNOSIS — Z8701 Personal history of pneumonia (recurrent): Secondary | ICD-10-CM | POA: Diagnosis not present

## 2024-05-05 DIAGNOSIS — K219 Gastro-esophageal reflux disease without esophagitis: Secondary | ICD-10-CM | POA: Diagnosis not present

## 2024-05-05 DIAGNOSIS — S43431D Superior glenoid labrum lesion of right shoulder, subsequent encounter: Secondary | ICD-10-CM | POA: Diagnosis not present

## 2024-05-05 DIAGNOSIS — Z9842 Cataract extraction status, left eye: Secondary | ICD-10-CM | POA: Diagnosis not present

## 2024-05-05 DIAGNOSIS — M7541 Impingement syndrome of right shoulder: Secondary | ICD-10-CM | POA: Diagnosis not present

## 2024-05-05 DIAGNOSIS — N6009 Solitary cyst of unspecified breast: Secondary | ICD-10-CM | POA: Diagnosis not present

## 2024-05-05 DIAGNOSIS — Z9841 Cataract extraction status, right eye: Secondary | ICD-10-CM | POA: Diagnosis not present

## 2024-05-08 ENCOUNTER — Ambulatory Visit (INDEPENDENT_AMBULATORY_CARE_PROVIDER_SITE_OTHER): Admitting: Gastroenterology

## 2024-05-12 DIAGNOSIS — Z96611 Presence of right artificial shoulder joint: Secondary | ICD-10-CM | POA: Diagnosis not present

## 2024-06-04 ENCOUNTER — Other Ambulatory Visit (INDEPENDENT_AMBULATORY_CARE_PROVIDER_SITE_OTHER): Payer: Self-pay | Admitting: Gastroenterology

## 2024-06-04 DIAGNOSIS — R1319 Other dysphagia: Secondary | ICD-10-CM

## 2024-06-04 DIAGNOSIS — K2100 Gastro-esophageal reflux disease with esophagitis, without bleeding: Secondary | ICD-10-CM

## 2024-07-03 ENCOUNTER — Encounter (INDEPENDENT_AMBULATORY_CARE_PROVIDER_SITE_OTHER): Payer: Self-pay | Admitting: Gastroenterology

## 2024-07-10 ENCOUNTER — Encounter (INDEPENDENT_AMBULATORY_CARE_PROVIDER_SITE_OTHER): Payer: Self-pay | Admitting: *Deleted

## 2024-07-16 ENCOUNTER — Other Ambulatory Visit: Payer: Self-pay | Admitting: Orthopedic Surgery

## 2024-08-08 ENCOUNTER — Ambulatory Visit (HOSPITAL_BASED_OUTPATIENT_CLINIC_OR_DEPARTMENT_OTHER): Admit: 2024-08-08 | Admitting: Orthopedic Surgery

## 2024-08-08 ENCOUNTER — Encounter (HOSPITAL_BASED_OUTPATIENT_CLINIC_OR_DEPARTMENT_OTHER): Payer: Self-pay

## 2024-08-14 ENCOUNTER — Ambulatory Visit (INDEPENDENT_AMBULATORY_CARE_PROVIDER_SITE_OTHER): Admitting: Gastroenterology
